# Patient Record
Sex: Female | Born: 1955 | Race: White | Hispanic: No | Marital: Single | Smoking: Never smoker
Health system: Southern US, Community
[De-identification: ages and names within clinical notes are randomized; demographics above are authoritative.]

## PROBLEM LIST (undated history)

## (undated) DIAGNOSIS — I219 Acute myocardial infarction, unspecified: Secondary | ICD-10-CM

## (undated) DIAGNOSIS — Z87442 Personal history of urinary calculi: Secondary | ICD-10-CM

## (undated) DIAGNOSIS — K219 Gastro-esophageal reflux disease without esophagitis: Secondary | ICD-10-CM

## (undated) DIAGNOSIS — I10 Essential (primary) hypertension: Secondary | ICD-10-CM

## (undated) DIAGNOSIS — E119 Type 2 diabetes mellitus without complications: Secondary | ICD-10-CM

## (undated) DIAGNOSIS — Z8719 Personal history of other diseases of the digestive system: Secondary | ICD-10-CM

## (undated) DIAGNOSIS — E7800 Pure hypercholesterolemia, unspecified: Secondary | ICD-10-CM

## (undated) DIAGNOSIS — M1990 Unspecified osteoarthritis, unspecified site: Secondary | ICD-10-CM

## (undated) DIAGNOSIS — R011 Cardiac murmur, unspecified: Secondary | ICD-10-CM

## (undated) HISTORY — PX: TONSILLECTOMY: SUR1361

## (undated) HISTORY — PX: BACK SURGERY: SHX140

## (undated) HISTORY — PX: BASAL CELL CARCINOMA EXCISION: SHX1214

## (undated) HISTORY — PX: OTHER SURGICAL HISTORY: SHX169

## (undated) HISTORY — PX: CATARACT EXTRACTION W/ INTRAOCULAR LENS  IMPLANT, BILATERAL: SHX1307

---

## 1999-08-26 ENCOUNTER — Other Ambulatory Visit: Admission: RE | Admit: 1999-08-26 | Discharge: 1999-08-26 | Payer: Self-pay | Admitting: Obstetrics & Gynecology

## 2000-11-09 ENCOUNTER — Other Ambulatory Visit: Admission: RE | Admit: 2000-11-09 | Discharge: 2000-11-09 | Payer: Self-pay | Admitting: Obstetrics & Gynecology

## 2003-04-29 ENCOUNTER — Encounter: Payer: Self-pay | Admitting: Orthopedic Surgery

## 2003-04-29 ENCOUNTER — Ambulatory Visit: Admission: RE | Admit: 2003-04-29 | Discharge: 2003-04-29 | Payer: Self-pay | Admitting: Orthopedic Surgery

## 2010-08-09 HISTORY — PX: NECK SURGERY: SHX720

## 2012-03-21 ENCOUNTER — Other Ambulatory Visit: Payer: Self-pay | Admitting: Obstetrics & Gynecology

## 2012-03-21 DIAGNOSIS — R928 Other abnormal and inconclusive findings on diagnostic imaging of breast: Secondary | ICD-10-CM

## 2012-03-27 ENCOUNTER — Ambulatory Visit
Admission: RE | Admit: 2012-03-27 | Discharge: 2012-03-27 | Disposition: A | Discharge status: Home / Self Care | Payer: Self-pay | Source: Ambulatory Visit | Attending: Obstetrics & Gynecology | Admitting: Obstetrics & Gynecology

## 2012-03-27 DIAGNOSIS — R928 Other abnormal and inconclusive findings on diagnostic imaging of breast: Secondary | ICD-10-CM

## 2012-11-08 ENCOUNTER — Other Ambulatory Visit: Payer: Self-pay | Admitting: Neurosurgery

## 2012-11-08 DIAGNOSIS — M5020 Other cervical disc displacement, unspecified cervical region: Secondary | ICD-10-CM

## 2012-11-10 ENCOUNTER — Ambulatory Visit
Admission: RE | Admit: 2012-11-10 | Discharge: 2012-11-10 | Disposition: A | Discharge status: Home / Self Care | Payer: BC Managed Care – PPO | Source: Ambulatory Visit | Attending: Neurosurgery | Admitting: Neurosurgery

## 2012-11-10 DIAGNOSIS — M5020 Other cervical disc displacement, unspecified cervical region: Secondary | ICD-10-CM

## 2012-11-14 ENCOUNTER — Encounter (HOSPITAL_COMMUNITY): Payer: Self-pay | Admitting: Pharmacy Technician

## 2012-11-14 ENCOUNTER — Other Ambulatory Visit: Payer: Self-pay | Admitting: Neurosurgery

## 2012-11-16 ENCOUNTER — Encounter (HOSPITAL_COMMUNITY)
Admission: RE | Admit: 2012-11-16 | Discharge: 2012-11-16 | Disposition: A | Discharge status: Home / Self Care | Payer: BC Managed Care – PPO | Source: Ambulatory Visit | Attending: Neurosurgery | Admitting: Neurosurgery

## 2012-11-16 ENCOUNTER — Encounter (HOSPITAL_COMMUNITY): Payer: Self-pay

## 2012-11-16 HISTORY — DX: Cardiac murmur, unspecified: R01.1

## 2012-11-16 HISTORY — DX: Unspecified osteoarthritis, unspecified site: M19.90

## 2012-11-16 LAB — BASIC METABOLIC PANEL
BUN: 22 mg/dL (ref 6–23)
CO2: 27 mEq/L (ref 19–32)
Chloride: 102 mEq/L (ref 96–112)
Creatinine, Ser: 0.65 mg/dL (ref 0.50–1.10)
GFR calc Af Amer: >90 mL/min (ref >90)
Potassium: 3.6 mEq/L (ref 3.5–5.1)

## 2012-11-16 LAB — CBC
HCT: 42.5 % (ref 36.0–46.0)
Hemoglobin: 15.5 g/dL — ABNORMAL HIGH (ref 12.0–15.0)
MCV: 84.3 fL (ref 78.0–100.0)
RBC: 5.04 MIL/uL (ref 3.87–5.11)
RDW: 13 % (ref 11.5–15.5)
WBC: 7.5 10*3/uL (ref 4.0–10.5)

## 2012-11-16 LAB — SURGICAL PCR SCREEN: Staphylococcus aureus: NEGATIVE

## 2012-11-16 NOTE — Progress Notes (Signed)
Primary Physician - Dr. Marina Goodell ([place]) Does not have a cardiologist No recent cardiac testing had stress test more than 10 years ago.

## 2012-11-16 NOTE — Pre-Procedure Instructions (Signed)
[name]  11/16/2012   Your procedure is scheduled on:  Monday, April 14th   Report to [facility] at  9:30 AM.             ( Arrival time is per your surgeon's request)   Call this number if you have problems the morning of surgery: [phone]   Remember:   Do not eat food or drink liquids after midnight Sunday.   Take these medicines the morning of surgery with A SIP OF WATER: Nothing   Do not wear jewelry, make-up or nail polish.  Do not wear lotions, powders, or perfumes. You may NOT wear deodorant.  Do not shave underarms & legs 48 hours prior to surgery.    Do not bring valuables to the hospital.  Contacts, dentures or bridgework may not be worn into surgery.   Leave suitcase in the car. After surgery it may be brought to your room.  For patients admitted to the hospital, checkout time is 11:00 AM the day of discharge.   Name and phone number of your driver:    Special Instructions: Shower using CHG 2 nights before surgery and the night before surgery.  If you shower the day of surgery use CHG.  Use special wash - you have one bottle of CHG for all showers.  You should use approximately 1/3 of the bottle for each shower.   Please read over the following fact sheets that you were given: Pain Booklet, Coughing and Deep Breathing, MRSA Information and Surgical Site Infection Prevention

## 2012-11-19 MED ORDER — CEFAZOLIN SODIUM-DEXTROSE 2-3 GM-% IV SOLR
2.0000 g | INTRAVENOUS | Status: AC
  Administered 2012-11-20: 2 g via INTRAVENOUS
  Filled 2012-11-19: qty 50

## 2012-11-20 ENCOUNTER — Encounter (HOSPITAL_COMMUNITY): Payer: Self-pay | Admitting: Certified Registered Nurse Anesthetist

## 2012-11-20 ENCOUNTER — Observation Stay (HOSPITAL_COMMUNITY)
Admission: RE | Admit: 2012-11-20 | Discharge: 2012-11-21 | Disposition: A | Discharge status: Home / Self Care | Payer: BC Managed Care – PPO | Source: Ambulatory Visit | Attending: Neurosurgery | Admitting: Neurosurgery

## 2012-11-20 ENCOUNTER — Encounter (HOSPITAL_COMMUNITY): Payer: Self-pay | Admitting: *Deleted

## 2012-11-20 ENCOUNTER — Ambulatory Visit (HOSPITAL_COMMUNITY): Payer: BC Managed Care – PPO | Admitting: Certified Registered Nurse Anesthetist

## 2012-11-20 ENCOUNTER — Ambulatory Visit (HOSPITAL_COMMUNITY): Payer: BC Managed Care – PPO

## 2012-11-20 ENCOUNTER — Encounter (HOSPITAL_COMMUNITY)
Admission: RE | Disposition: A | Discharge status: Home / Self Care | Payer: Self-pay | Source: Ambulatory Visit | Attending: Neurosurgery

## 2012-11-20 DIAGNOSIS — Z01810 Encounter for preprocedural cardiovascular examination: Secondary | ICD-10-CM | POA: Insufficient documentation

## 2012-11-20 DIAGNOSIS — M5030 Other cervical disc degeneration, unspecified cervical region: Secondary | ICD-10-CM | POA: Insufficient documentation

## 2012-11-20 DIAGNOSIS — M47812 Spondylosis without myelopathy or radiculopathy, cervical region: Secondary | ICD-10-CM | POA: Insufficient documentation

## 2012-11-20 DIAGNOSIS — E119 Type 2 diabetes mellitus without complications: Secondary | ICD-10-CM | POA: Insufficient documentation

## 2012-11-20 DIAGNOSIS — Z01812 Encounter for preprocedural laboratory examination: Secondary | ICD-10-CM | POA: Insufficient documentation

## 2012-11-20 DIAGNOSIS — Z981 Arthrodesis status: Secondary | ICD-10-CM | POA: Insufficient documentation

## 2012-11-20 DIAGNOSIS — M5020 Other cervical disc displacement, unspecified cervical region: Principal | ICD-10-CM | POA: Insufficient documentation

## 2012-11-20 HISTORY — PX: ANTERIOR CERVICAL DECOMP/DISCECTOMY FUSION: SHX1161

## 2012-11-20 LAB — GLUCOSE, CAPILLARY
Glucose-Capillary: 179 mg/dL — ABNORMAL HIGH (ref 70–99)
Glucose-Capillary: 171 mg/dL — ABNORMAL HIGH (ref 70–99)
Glucose-Capillary: 287 mg/dL — ABNORMAL HIGH (ref 70–99)
Glucose-Capillary: 229 mg/dL — ABNORMAL HIGH (ref 70–99)

## 2012-11-20 SURGERY — ANTERIOR CERVICAL DECOMPRESSION/DISCECTOMY FUSION 1 LEVEL
Anesthesia: General | Site: Neck | Wound class: Clean

## 2012-11-20 MED ORDER — ALBUMIN HUMAN 5 % IV SOLN
INTRAVENOUS | Status: DC | PRN
  Administered 2012-11-20: 11:00:00 via INTRAVENOUS

## 2012-11-20 MED ORDER — KETOROLAC TROMETHAMINE 30 MG/ML IJ SOLN
30.0000 mg | Freq: Four times a day (QID) | INTRAMUSCULAR | Status: DC
  Administered 2012-11-20 – 2012-11-21 (×4): 30 mg via INTRAVENOUS
  Filled 2012-11-20 (×7): qty 1

## 2012-11-20 MED ORDER — 0.9 % SODIUM CHLORIDE (POUR BTL) OPTIME
TOPICAL | Status: DC | PRN
  Administered 2012-11-20: 1000 mL

## 2012-11-20 MED ORDER — SODIUM CHLORIDE 0.9 % IJ SOLN: 3.0000 mL | INTRAMUSCULAR | Status: DC | PRN

## 2012-11-20 MED ORDER — LIDOCAINE-EPINEPHRINE 1 %-1:100000 IJ SOLN
INTRAMUSCULAR | Status: DC | PRN
  Administered 2012-11-20: 20 mL

## 2012-11-20 MED ORDER — LACTATED RINGERS IV SOLN
INTRAVENOUS | Status: DC | PRN
  Administered 2012-11-20 (×2): via INTRAVENOUS

## 2012-11-20 MED ORDER — MIDAZOLAM HCL 5 MG/5ML IJ SOLN
INTRAMUSCULAR | Status: DC | PRN
  Administered 2012-11-20: 2 mg via INTRAVENOUS

## 2012-11-20 MED ORDER — KETOROLAC TROMETHAMINE 30 MG/ML IJ SOLN: 30.0000 mg | Freq: Once | INTRAMUSCULAR | Status: DC

## 2012-11-20 MED ORDER — BUPIVACAINE HCL (PF) 0.5 % IJ SOLN
INTRAMUSCULAR | Status: DC | PRN
  Administered 2012-11-20: 30 mL

## 2012-11-20 MED ORDER — OXYCODONE HCL 5 MG PO TABS: 5.0000 mg | ORAL_TABLET | ORAL | Status: DC | PRN

## 2012-11-20 MED ORDER — ACETAMINOPHEN 650 MG RE SUPP: 650.0000 mg | RECTAL | Status: DC | PRN

## 2012-11-20 MED ORDER — PHENYLEPHRINE HCL 10 MG/ML IJ SOLN
INTRAMUSCULAR | Status: DC | PRN
  Administered 2012-11-20: 80 ug via INTRAVENOUS

## 2012-11-20 MED ORDER — BISACODYL 10 MG RE SUPP: 10.0000 mg | Freq: Every day | RECTAL | Status: DC | PRN

## 2012-11-20 MED ORDER — HYDROMORPHONE HCL PF 1 MG/ML IJ SOLN
0.2500 mg | INTRAMUSCULAR | Status: DC | PRN
Start: 2012-11-20 — End: 2012-11-20
  Administered 2012-11-20: 0.5 mg via INTRAVENOUS

## 2012-11-20 MED ORDER — ARTIFICIAL TEARS OP OINT
TOPICAL_OINTMENT | OPHTHALMIC | Status: DC | PRN
  Administered 2012-11-20: 1 via OPHTHALMIC

## 2012-11-20 MED ORDER — POTASSIUM CHLORIDE IN NACL 20-0.9 MEQ/L-% IV SOLN
INTRAVENOUS | Status: DC
  Administered 2012-11-20: 18:00:00 via INTRAVENOUS
  Filled 2012-11-20 (×4): qty 1000

## 2012-11-20 MED ORDER — ACETAMINOPHEN 325 MG PO TABS: 650.0000 mg | ORAL_TABLET | ORAL | Status: DC | PRN

## 2012-11-20 MED ORDER — SODIUM CHLORIDE 0.9 % IJ SOLN
3.0000 mL | Freq: Two times a day (BID) | INTRAMUSCULAR | Status: DC
  Administered 2012-11-20 (×2): 3 mL via INTRAVENOUS

## 2012-11-20 MED ORDER — THROMBIN 5000 UNITS EX SOLR
OROMUCOSAL | Status: DC | PRN
  Administered 2012-11-20: 11:00:00 via TOPICAL

## 2012-11-20 MED ORDER — ONDANSETRON HCL 4 MG/2ML IJ SOLN: 4.0000 mg | Freq: Four times a day (QID) | INTRAMUSCULAR | Status: DC | PRN

## 2012-11-20 MED ORDER — SODIUM CHLORIDE 0.9 % IV SOLN
INTRAVENOUS | Status: AC
  Filled 2012-11-20: qty 500

## 2012-11-20 MED ORDER — MAGNESIUM HYDROXIDE 400 MG/5ML PO SUSP: 30.0000 mL | Freq: Every day | ORAL | Status: DC | PRN

## 2012-11-20 MED ORDER — MORPHINE SULFATE 4 MG/ML IJ SOLN: 4.0000 mg | INTRAMUSCULAR | Status: DC | PRN

## 2012-11-20 MED ORDER — LIDOCAINE HCL (CARDIAC) 20 MG/ML IV SOLN
INTRAVENOUS | Status: DC | PRN
  Administered 2012-11-20: 70 mg via INTRAVENOUS

## 2012-11-20 MED ORDER — BACITRACIN 50000 UNITS IM SOLR
INTRAMUSCULAR | Status: AC
  Filled 2012-11-20: qty 1

## 2012-11-20 MED ORDER — ALUM & MAG HYDROXIDE-SIMETH 200-200-20 MG/5ML PO SUSP: 30.0000 mL | Freq: Four times a day (QID) | ORAL | Status: DC | PRN

## 2012-11-20 MED ORDER — SODIUM CHLORIDE 0.9 % IR SOLN
Status: DC | PRN
  Administered 2012-11-20: 10:00:00

## 2012-11-20 MED ORDER — THROMBIN 20000 UNITS EX SOLR
CUTANEOUS | Status: DC | PRN
  Administered 2012-11-20: 20000 [IU] via TOPICAL

## 2012-11-20 MED ORDER — FENTANYL CITRATE 0.05 MG/ML IJ SOLN
INTRAMUSCULAR | Status: DC | PRN
  Administered 2012-11-20 (×2): 125 ug via INTRAVENOUS

## 2012-11-20 MED ORDER — PHENOL 1.4 % MT LIQD: 1.0000 | OROMUCOSAL | Status: DC | PRN

## 2012-11-20 MED ORDER — ACETAMINOPHEN 10 MG/ML IV SOLN
1000.0000 mg | Freq: Four times a day (QID) | INTRAVENOUS | Status: DC
  Administered 2012-11-20 – 2012-11-21 (×3): 1000 mg via INTRAVENOUS
  Filled 2012-11-20 (×4): qty 100

## 2012-11-20 MED ORDER — CYCLOBENZAPRINE HCL 10 MG PO TABS: 10.0000 mg | ORAL_TABLET | Freq: Three times a day (TID) | ORAL | Status: DC | PRN

## 2012-11-20 MED ORDER — GLYCOPYRROLATE 0.2 MG/ML IJ SOLN
INTRAMUSCULAR | Status: DC | PRN
Start: 2012-11-20 — End: 2012-11-20
  Administered 2012-11-20: 0.4 mg via INTRAVENOUS

## 2012-11-20 MED ORDER — LIDOCAINE HCL 4 % MT SOLN
OROMUCOSAL | Status: DC | PRN
  Administered 2012-11-20: 4 mL via TOPICAL

## 2012-11-20 MED ORDER — ROCURONIUM BROMIDE 100 MG/10ML IV SOLN
INTRAVENOUS | Status: DC | PRN
  Administered 2012-11-20: 40 mg via INTRAVENOUS

## 2012-11-20 MED ORDER — HEMOSTATIC AGENTS (NO CHARGE) OPTIME
TOPICAL | Status: DC | PRN
  Administered 2012-11-20: 1 via TOPICAL

## 2012-11-20 MED ORDER — ACETAMINOPHEN 10 MG/ML IV SOLN
INTRAVENOUS | Status: AC
  Administered 2012-11-20: 1000 mg via INTRAVENOUS
  Filled 2012-11-20: qty 100

## 2012-11-20 MED ORDER — EPHEDRINE SULFATE 50 MG/ML IJ SOLN
INTRAMUSCULAR | Status: DC | PRN
  Administered 2012-11-20 (×2): 10 mg via INTRAVENOUS

## 2012-11-20 MED ORDER — DEXAMETHASONE SODIUM PHOSPHATE 4 MG/ML IJ SOLN
INTRAMUSCULAR | Status: DC | PRN
  Administered 2012-11-20: 4 mg via INTRAVENOUS

## 2012-11-20 MED ORDER — ONDANSETRON HCL 4 MG/2ML IJ SOLN
INTRAMUSCULAR | Status: DC | PRN
  Administered 2012-11-20: 4 mg via INTRAVENOUS

## 2012-11-20 MED ORDER — KETOROLAC TROMETHAMINE 30 MG/ML IJ SOLN
INTRAMUSCULAR | Status: AC
  Filled 2012-11-20: qty 1

## 2012-11-20 MED ORDER — NEOSTIGMINE METHYLSULFATE 1 MG/ML IJ SOLN
INTRAMUSCULAR | Status: DC | PRN
  Administered 2012-11-20: 3 mg via INTRAVENOUS

## 2012-11-20 MED ORDER — MENTHOL 3 MG MT LOZG: 1.0000 | LOZENGE | OROMUCOSAL | Status: DC | PRN

## 2012-11-20 MED ORDER — PROPOFOL 10 MG/ML IV BOLUS
INTRAVENOUS | Status: DC | PRN
  Administered 2012-11-20: 200 mg via INTRAVENOUS

## 2012-11-20 MED ORDER — ZOLPIDEM TARTRATE 5 MG PO TABS: 5.0000 mg | ORAL_TABLET | Freq: Every evening | ORAL | Status: DC | PRN

## 2012-11-20 MED ORDER — HYDROXYZINE HCL 25 MG PO TABS: 50.0000 mg | ORAL_TABLET | ORAL | Status: DC | PRN

## 2012-11-20 MED ORDER — INSULIN GLARGINE 100 UNIT/ML ~~LOC~~ SOLN
20.0000 [IU] | Freq: Every day | SUBCUTANEOUS | Status: DC
  Administered 2012-11-20: 30 [IU] via SUBCUTANEOUS
  Filled 2012-11-20 (×2): qty 0.3

## 2012-11-20 MED ORDER — HYDROMORPHONE HCL PF 1 MG/ML IJ SOLN
INTRAMUSCULAR | Status: AC
  Filled 2012-11-20: qty 1

## 2012-11-20 MED ORDER — ONDANSETRON HCL 4 MG/2ML IJ SOLN: 4.0000 mg | Freq: Once | INTRAMUSCULAR | Status: DC | PRN

## 2012-11-20 SURGICAL SUPPLY — 59 items
ADH SKN CLS APL DERMABOND .7 (GAUZE/BANDAGES/DRESSINGS) ×1
ADH SKN CLS LQ APL DERMABOND (GAUZE/BANDAGES/DRESSINGS) ×1
ALLOGRAFT 7X14X11 (Bone Implant) ×2 IMPLANT
BAG DECANTER FOR FLEXI CONT (MISCELLANEOUS) ×2 IMPLANT
BIT DRILL NEURO 2X3.1 SFT TUCH (MISCELLANEOUS) ×1 IMPLANT
BLADE ULTRA TIP 2M (BLADE) ×2 IMPLANT
BRUSH SCRUB EZ PLAIN DRY (MISCELLANEOUS) ×2 IMPLANT
CANISTER SUCTION 2500CC (MISCELLANEOUS) ×2 IMPLANT
CLIP TI MEDIUM 6 (CLIP) ×2 IMPLANT
CLOTH BEACON ORANGE TIMEOUT ST (SAFETY) ×2 IMPLANT
CONT SPEC 4OZ CLIKSEAL STRL BL (MISCELLANEOUS) ×2 IMPLANT
COVER MAYO STAND STRL (DRAPES) ×2 IMPLANT
DECANTER SPIKE VIAL GLASS SM (MISCELLANEOUS) ×2 IMPLANT
DERMABOND ADHESIVE PROPEN (GAUZE/BANDAGES/DRESSINGS) ×1
DERMABOND ADVANCED (GAUZE/BANDAGES/DRESSINGS) ×1
DERMABOND ADVANCED .7 DNX12 (GAUZE/BANDAGES/DRESSINGS) ×1 IMPLANT
DERMABOND ADVANCED .7 DNX6 (GAUZE/BANDAGES/DRESSINGS) ×1 IMPLANT
DRAPE LAPAROTOMY 100X72 PEDS (DRAPES) ×2 IMPLANT
DRAPE MICROSCOPE LEICA (MISCELLANEOUS) ×2 IMPLANT
DRAPE POUCH INSTRU U-SHP 10X18 (DRAPES) ×2 IMPLANT
DRAPE PROXIMA HALF (DRAPES) IMPLANT
DRILL NEURO 2X3.1 SOFT TOUCH (MISCELLANEOUS) ×2
ELECT COATED BLADE 2.86 ST (ELECTRODE) ×2 IMPLANT
ELECT REM PT RETURN 9FT ADLT (ELECTROSURGICAL) ×2
ELECTRODE REM PT RTRN 9FT ADLT (ELECTROSURGICAL) ×1 IMPLANT
GLOVE BIOGEL PI IND STRL 8 (GLOVE) ×3 IMPLANT
GLOVE BIOGEL PI IND STRL 8.5 (GLOVE) ×1 IMPLANT
GLOVE BIOGEL PI INDICATOR 8 (GLOVE) ×3
GLOVE BIOGEL PI INDICATOR 8.5 (GLOVE) ×1
GLOVE ECLIPSE 7.5 STRL STRAW (GLOVE) ×2 IMPLANT
GLOVE ECLIPSE 8.5 STRL (GLOVE) ×2 IMPLANT
GLOVE EXAM NITRILE LRG STRL (GLOVE) ×2 IMPLANT
GLOVE EXAM NITRILE MD LF STRL (GLOVE) IMPLANT
GLOVE EXAM NITRILE XL STR (GLOVE) IMPLANT
GLOVE EXAM NITRILE XS STR PU (GLOVE) IMPLANT
GLOVE INDICATOR 8.0 STRL GRN (GLOVE) ×6 IMPLANT
GOWN BRE IMP SLV AUR LG STRL (GOWN DISPOSABLE) IMPLANT
GOWN BRE IMP SLV AUR XL STRL (GOWN DISPOSABLE) ×5 IMPLANT
GOWN STRL REIN 2XL LVL4 (GOWN DISPOSABLE) ×4 IMPLANT
HEAD HALTER (SOFTGOODS) ×2 IMPLANT
HEMOSTAT POWDER SURGIFOAM 1G (HEMOSTASIS) ×1 IMPLANT
HEMOSTAT SURGICEL 2X14 (HEMOSTASIS) ×1 IMPLANT
KIT BASIN OR (CUSTOM PROCEDURE TRAY) ×2 IMPLANT
KIT ROOM TURNOVER OR (KITS) ×2 IMPLANT
NEEDLE HYPO 25X1 1.5 SAFETY (NEEDLE) ×2 IMPLANT
NEEDLE SPNL 22GX3.5 QUINCKE BK (NEEDLE) ×2 IMPLANT
NS IRRIG 1000ML POUR BTL (IV SOLUTION) ×2 IMPLANT
PACK LAMINECTOMY NEURO (CUSTOM PROCEDURE TRAY) ×2 IMPLANT
PAD ARMBOARD 7.5X6 YLW CONV (MISCELLANEOUS) ×6 IMPLANT
RUBBERBAND STERILE (MISCELLANEOUS) ×4 IMPLANT
SPONGE INTESTINAL PEANUT (DISPOSABLE) ×2 IMPLANT
SPONGE SURGIFOAM ABS GEL SZ50 (HEMOSTASIS) ×4 IMPLANT
STAPLER SKIN PROX WIDE 3.9 (STAPLE) ×2 IMPLANT
SUT VIC AB 2-0 CP2 18 (SUTURE) ×2 IMPLANT
SUT VIC AB 3-0 SH 8-18 (SUTURE) ×2 IMPLANT
SYR 20ML ECCENTRIC (SYRINGE) ×2 IMPLANT
TOWEL OR 17X24 6PK STRL BLUE (TOWEL DISPOSABLE) IMPLANT
TOWEL OR 17X26 10 PK STRL BLUE (TOWEL DISPOSABLE) ×2 IMPLANT
WATER STERILE IRR 1000ML POUR (IV SOLUTION) ×2 IMPLANT

## 2012-11-20 NOTE — Anesthesia Postprocedure Evaluation (Signed)
  Anesthesia Post-op Note  Patient: [name]  Procedure(s) Performed: Procedure(s) with comments: ANTERIOR CERVICAL DECOMPRESSION/DISCECTOMY FUSION 1 LEVEL (N/A) - Cervical four - five  anterior cervical decompression with fusion plating and bonegraft  Patient Location: PACU  Anesthesia Type:General  Level of Consciousness: awake, oriented and patient cooperative  Airway and Oxygen Therapy: Patient Spontanous Breathing  Post-op Pain: mild  Post-op Assessment: Post-op Vital signs reviewed, Patient's Cardiovascular Status Stable, Respiratory Function Stable, Patent Airway, No signs of Nausea or vomiting and Pain level controlled  Post-op Vital Signs: stable  Complications: No apparent anesthesia complications

## 2012-11-20 NOTE — H&P (Signed)
Subjective: Patient is a 57 y.o. female who is admitted for treatment of acute left C4-5 cervical disc herniation with profound weakness of the left deltoid and marked weakness of the left biceps.  The patient is 5 weeks status post a 2 level C5-6 and C6-7 ACDF. She did well initially following surgery but about 1 week following surgery she developed acute pain in the proximal left upper extremity. An x-ray at that time looked good. Patient was treated with an NSAID.  When she returns a couple of weeks later she had developed significant weakness in the left deltoid and biceps. We then further evaluate her with CT and MRI of the cervical spine. CT showed a good fusion construct at the C5-6 and C6-7 levels without neural impingement at those levels. The MRI scan also confirmed good decompression at the C5-6 and C6-7 levels, however at the C4-5 level although there had been a mild uncinate spur encroaching the left C4-5 neural foramen prior to her surgery last month, there was now seen a superimposed soft disc herniation that was not present on the preoperative MRI from February. Patient is admitted now for a C4-5 anterior cervical decompression arthrodesis with allograft and cervical plating because of both the pain but most significantly because of the profound weakness of the proximal left upper extremity.   Past Medical History  Diagnosis Date  . Heart murmur   . Diabetes mellitus without complication     fasting blood sugars 150s  . Arthritis     Past Surgical History  Procedure Laterality Date  . Neck fusion    . Eye surgery      cataract bilteral  . Tonsillectomy      Prescriptions prior to admission  Medication Sig Dispense Refill  . insulin glargine (LANTUS) 100 UNIT/ML injection Inject 20-30 Units into the skin at bedtime. Based on sugar levels       Allergies  Allergen Reactions  . Sulfa Antibiotics Other (See Comments)    Blisters on nose    History  Substance Use Topics  .  Smoking status: Never Smoker   . Smokeless tobacco: Not on file  . Alcohol Use: No    History reviewed. No pertinent family history.   Review of Systems A comprehensive review of systems was negative.  Objective: Vital signs in last 24 hours: Temp:  [97.7 F (36.5 C)] 97.7 F (36.5 C) (04/14 0706) Pulse Rate:  [82] 82 (04/14 0706) Resp:  [22] 22 (04/14 0706) BP: (143)/(102) 143/102 mmHg (04/14 0706) SpO2:  [97 %] 97 % (04/14 0706)  EXAM: Patient is a well-developed well-nourished white female in no acute distress. Previous anterior cervical incision is nicely healed. Lungs are clear to auscultation , the patient has symmetrical respiratory excursion. Heart has a regular rate and rhythm normal S1 and S2 no murmur.   Abdomen is soft nontender nondistended bowel sounds are present. Extremity examination shows no clubbing cyanosis or edema. Neurologic examination shows the right upper extremity with 5/5 strength in the deltoid, biceps, triceps, intrinsics, and grip. The left upper extremity he shows the deltoid is 2/5, the biceps is 3-4 minus/5, in the triceps, intrinsics, grip are 5/5.  Data Review:CBC    Component Value Date/Time   WBC 7.5 11/16/2012 1416   RBC 5.04 11/16/2012 1416   HGB 15.5* 11/16/2012 1416   HCT 42.5 11/16/2012 1416   PLT 197 11/16/2012 1416   MCV 84.3 11/16/2012 1416   MCH 30.8 11/16/2012 1416   MCHC 36.5* 11/16/2012  1416   RDW 13.0 11/16/2012 1416                          BMET    Component Value Date/Time   NA 138 11/16/2012 1416   K 3.6 11/16/2012 1416   CL 102 11/16/2012 1416   CO2 27 11/16/2012 1416   GLUCOSE 135* 11/16/2012 1416   BUN 22 11/16/2012 1416   CREATININE 0.65 11/16/2012 1416   CALCIUM 10.0 11/16/2012 1416   GFRNONAA >90 11/16/2012 1416   GFRAA >90 11/16/2012 1416     Assessment/Plan: Patient with an acute left C4-5 cervical disc herniation with pronounced weakness of the left deltoid and biceps, admitted for a C4-5 ACDF.  I've discussed with the  patient the nature of his condition, the nature the surgical procedure, the typical length of surgery, hospital stay, and overall recuperation. We discussed limitations postoperatively. I discussed risks of surgery including risks of infection, bleeding, possibly need for transfusion, the risk of nerve root dysfunction with pain, weakness, numbness, or paresthesias, the risk of spinal cord dysfunction with paralysis of all 4 limbs and quadriplegia, and the risk of dural tear and CSF leakage and possible need for further surgery, the risk of esophageal dysfunction causing dysphagia and the risk of laryngeal dysfunction causing hoarseness of the voice, the risk of failure of the arthrodesis and the possible need for further surgery, and the risk of anesthetic complications including myocardial infarction, stroke, pneumonia, and death. We also discussed the need for postoperative immobilization in a cervical collar. Understanding all this the patient does wish to proceed with surgery and is admitted for such.    Hewitt Shorts, MD 11/20/2012 9:53 AM

## 2012-11-20 NOTE — Progress Notes (Signed)
Filed Vitals:   11/20/12 1310 11/20/12 1342 11/20/12 1655 11/20/12 1656  BP:  130/71 138/91 138/91  Pulse: 77 82 94 77  Temp: 97.2 F (36.2 C) 97.7 F (36.5 C) 98.2 F (36.8 C) 98.2 F (36.8 C)  TempSrc:      Resp: 15 18 18 18   SpO2: 94% 96% 94% 94%    Patient resting in bed, comfortable. Has been up and living in the halls. Has voided. Wound clean dry. Eating.  Plan: Continue progress to postoperative recovery.  Hewitt Shorts, MD 11/20/2012, 5:16 PM

## 2012-11-20 NOTE — Plan of Care (Signed)
Problem: Consults Goal: Diagnosis - Spinal Surgery Outcome: Completed/Met Date Met:  11/20/12 Cervical Spine Fusion     

## 2012-11-20 NOTE — Transfer of Care (Signed)
Immediate Anesthesia Transfer of Care Note  Patient: [name]  Procedure(s) Performed: Procedure(s) with comments: ANTERIOR CERVICAL DECOMPRESSION/DISCECTOMY FUSION 1 LEVEL (N/A) - Cervical four - five  anterior cervical decompression with fusion plating and bonegraft  Patient Location: PACU  Anesthesia Type:General  Level of Consciousness: awake, sedated and patient cooperative  Airway & Oxygen Therapy: Patient Spontanous Breathing and Patient connected to nasal cannula oxygen  Post-op Assessment: Report given to PACU RN, Post -op Vital signs reviewed and stable and Patient moving all extremities X 4  Post vital signs: Reviewed and stable  Complications: No apparent anesthesia complications

## 2012-11-20 NOTE — Anesthesia Procedure Notes (Signed)
Procedure Name: Intubation Date/Time: 11/20/2012 10:08 AM Performed by: Orvilla Fus A Pre-anesthesia Checklist: Patient identified, Timeout performed, Emergency Drugs available, Suction available and Patient being monitored Patient Re-evaluated:Patient Re-evaluated prior to inductionOxygen Delivery Method: Circle system utilized Preoxygenation: Pre-oxygenation with 100% oxygen Intubation Type: IV induction Ventilation: Mask ventilation without difficulty Laryngoscope Size: Mac and 4 Grade View: Grade III Tube type: Oral Tube size: 7.0 mm Number of attempts: 2 (DLx1 CRNA, DLx1 MDA) Airway Equipment and Method: Stylet,  LTA kit utilized and Bougie stylet Placement Confirmation: ETT inserted through vocal cords under direct vision,  breath sounds checked- equal and bilateral and positive ETCO2 Secured at: 21 cm Tube secured with: Tape Dental Injury: Teeth and Oropharynx as per pre-operative assessment

## 2012-11-20 NOTE — Op Note (Signed)
11/20/2012  12:13 PM  PATIENT:  [name]  57 y.o. female  PRE-OPERATIVE DIAGNOSIS:  C4-5 herniated cervical disc, cervical spondylosis, cervical degenerative disease, cervical radiculopathy  POST-OPERATIVE DIAGNOSIS:  C4-5 herniated cervical disc, cervical spondylosis, cervical degenerative disease, cervical radiculopathy  PROCEDURE:  Procedure(s): ANTERIOR CERVICAL DECOMPRESSION/DISCECTOMY FUSION 1 LEVEL: C4-5 anterior cervical decompression arthrodesis with allograft and tether cervical plating  SURGEON:  Surgeon(s): Hewitt Shorts, MD Temple Pacini, MD  ASSISTANTS: Julio Sicks, M.D.  ANESTHESIA:   general  EBL:  Total I/O In: 1950 [I.V.:1700; IV Piggyback:250] Out: 250 [Blood:250]  BLOOD ADMINISTERED:none  COUNT: Correct per nursing staff  DICTATION: Patient was brought to the operating room placed under general endotracheal anesthesia. Patient was placed in 10 pounds of halter traction. The neck was prepped with Betadine soap and solution and draped in a sterile fashion. A horizontal incision was made on the left side of the neck. The line of the incision was infiltrated with local anesthetic with epinephrine. Dissection was carried down thru the subcutaneous tissue and platysma, bipolar cautery was used to maintain hemostasis. Dissection was then carried down thru an avascular plane leaving the sternocleidomastoid carotid artery and jugular vein laterally and the trachea and esophagus medially. The ventral aspect of the vertebral column was identified, we the existing plate in its upper extend at the C5 vertebra, and thereby identified the C4-5 level. The annulus was incised and the disc space entered. Discectomy was performed with micro-curettes and pituitary rongeurs. The operating microscope was draped and brought into the field provided additional magnification illumination and visualization. Discectomy was continued posteriorly thru the disc space and then the  cartilaginous endplate was removed using micro-curettes along with the high-speed drill. Posterior osteophytic overgrowth was removed using the high-speed drill along with a 2 mm thin footplated Kerrison punch. Posterior longitudinal ligament along with disc herniation was carefully removed, decompressing the spinal canal and thecal sac. We then continued to remove osteophytic overgrowth and disc material decompressing the neural foramina and exiting nerve roots bilaterally. Once the decompression was completed hemostasis was established with the use of Gelfoam with thrombin, Surgifoam, and bipolar cautery. The Gelfoam was removed the wound irrigated and hemostasis confirmed. We had encountered some bleeding from the overlying soft tissues, this was controlled with bipolar cautery, and we did lay some Surgicel over the area as well. We then measured the height of the intravertebral disc space and selected a 7 millimeter in height structural allograft. It was hydrated and saline solution and then gently positioned in the intravertebral disc space and countersunk. We then selected a 12 millimeter in height Tether cervical plate. It was positioned over the fusion construct and secured to the vertebra with 4 x 13 mm fixed screws at the C4 and level, and 4 x 13 mm variable screws at the C5 level. Each screw hole was started with the high-speed drill and then the screws placed once all the screws were placed final tightening was performed. The wound was irrigated with bacitracin solution checked for hemostasis which was established and confirmed. An x-ray was taken which showed grafts in good position, the plate and screws in good position, and the overall alignment to be good. We then proceeded with closure. The platysma was closed with interrupted inverted 2-0 undyed Vicryl suture, the subcutaneous and subcuticular closed with interrupted inverted 3-0 undyed Vicryl suture. The skin edges were approximated with  Dermabond. Following surgery the patient was taken out of cervical traction. To be reversed and  the anesthetic and taken to the recovery room for further care.   PLAN OF CARE: Admit for overnight observation  PATIENT DISPOSITION:  PACU - hemodynamically stable.   Delay start of Pharmacological VTE agent (>24hrs) due to surgical blood loss or risk of bleeding:  yes

## 2012-11-20 NOTE — Anesthesia Preprocedure Evaluation (Signed)
Anesthesia Evaluation  Patient identified by MRN, date of birth, ID band Patient awake    Reviewed: Allergy & Precautions, H&P , NPO status , Patient's Chart, lab work & pertinent test results  Airway Mallampati: I TM Distance: >3 FB Neck ROM: full    Dental   Pulmonary          Cardiovascular Rhythm:regular Rate:Normal     Neuro/Psych    GI/Hepatic   Endo/Other  diabetes, Type 2, Oral Hypoglycemic Agents  Renal/GU      Musculoskeletal   Abdominal   Peds  Hematology   Anesthesia Other Findings   Reproductive/Obstetrics                           Anesthesia Physical Anesthesia Plan  ASA: III  Anesthesia Plan: General   Post-op Pain Management:    Induction: Intravenous  Airway Management Planned: Oral ETT  Additional Equipment:   Intra-op Plan:   Post-operative Plan: Extubation in OR  Informed Consent: I have reviewed the patients History and Physical, chart, labs and discussed the procedure including the risks, benefits and alternatives for the proposed anesthesia with the patient or authorized representative who has indicated his/her understanding and acceptance.     Plan Discussed with: CRNA, Anesthesiologist and Surgeon  Anesthesia Plan Comments:         Anesthesia Quick Evaluation

## 2012-11-20 NOTE — Preoperative (Signed)
Beta Blockers   Reason not to administer Beta Blockers:Not Applicable 

## 2012-11-21 ENCOUNTER — Encounter (HOSPITAL_COMMUNITY): Payer: Self-pay | Admitting: Neurosurgery

## 2012-11-21 LAB — GLUCOSE, CAPILLARY: Glucose-Capillary: 111 mg/dL — ABNORMAL HIGH (ref 70–99)

## 2012-11-21 NOTE — Discharge Summary (Signed)
Physician Discharge Summary  Patient ID: [name] MRN: [id number] DOB/AGE: 03/01/1956 57 y.o.  Admit date: 11/20/2012 Discharge date: 11/21/2012  Admission Diagnoses:  C4-5 herniated cervical disc, cervical spondylosis, cervical degenerative disease, cervical radiculopathy  Discharge Diagnoses:   C4-5 herniated cervical disc, cervical spondylosis, cervical degenerative disease, cervical radiculopathy  Discharged Condition: good  Hospital Course: Patient, underwent a C4-5 ACDF. She is done well following surgery. She is up and living. Her wound is healing well. She is voiding well. She is eating well. She is being discharged home with instructions regarding wound care and activities. She is to return for followup with me in 3 weeks.  Discharge Exam: Blood pressure 122/80, pulse 72, temperature 98.5 F (36.9 C), temperature source Oral, resp. rate 18, SpO2 94.00%.  Disposition: Home     Medication List    TAKE these medications       insulin glargine 100 UNIT/ML injection  Commonly known as:  LANTUS  Inject 20-30 Units into the skin at bedtime. Based on sugar levels         Signed: Hewitt Shorts, MD 11/21/2012, 9:10 AM

## 2013-08-14 IMAGING — CR DG CERVICAL SPINE 1V
1 series · 1 of 1 positions shown · non-contrast
Comparison: CT cervical spine 11/10/2012

CLINICAL DATA: Cervical fusion C4-5

DG CERVICAL SPINE - 1 VIEW

[view not recorded]
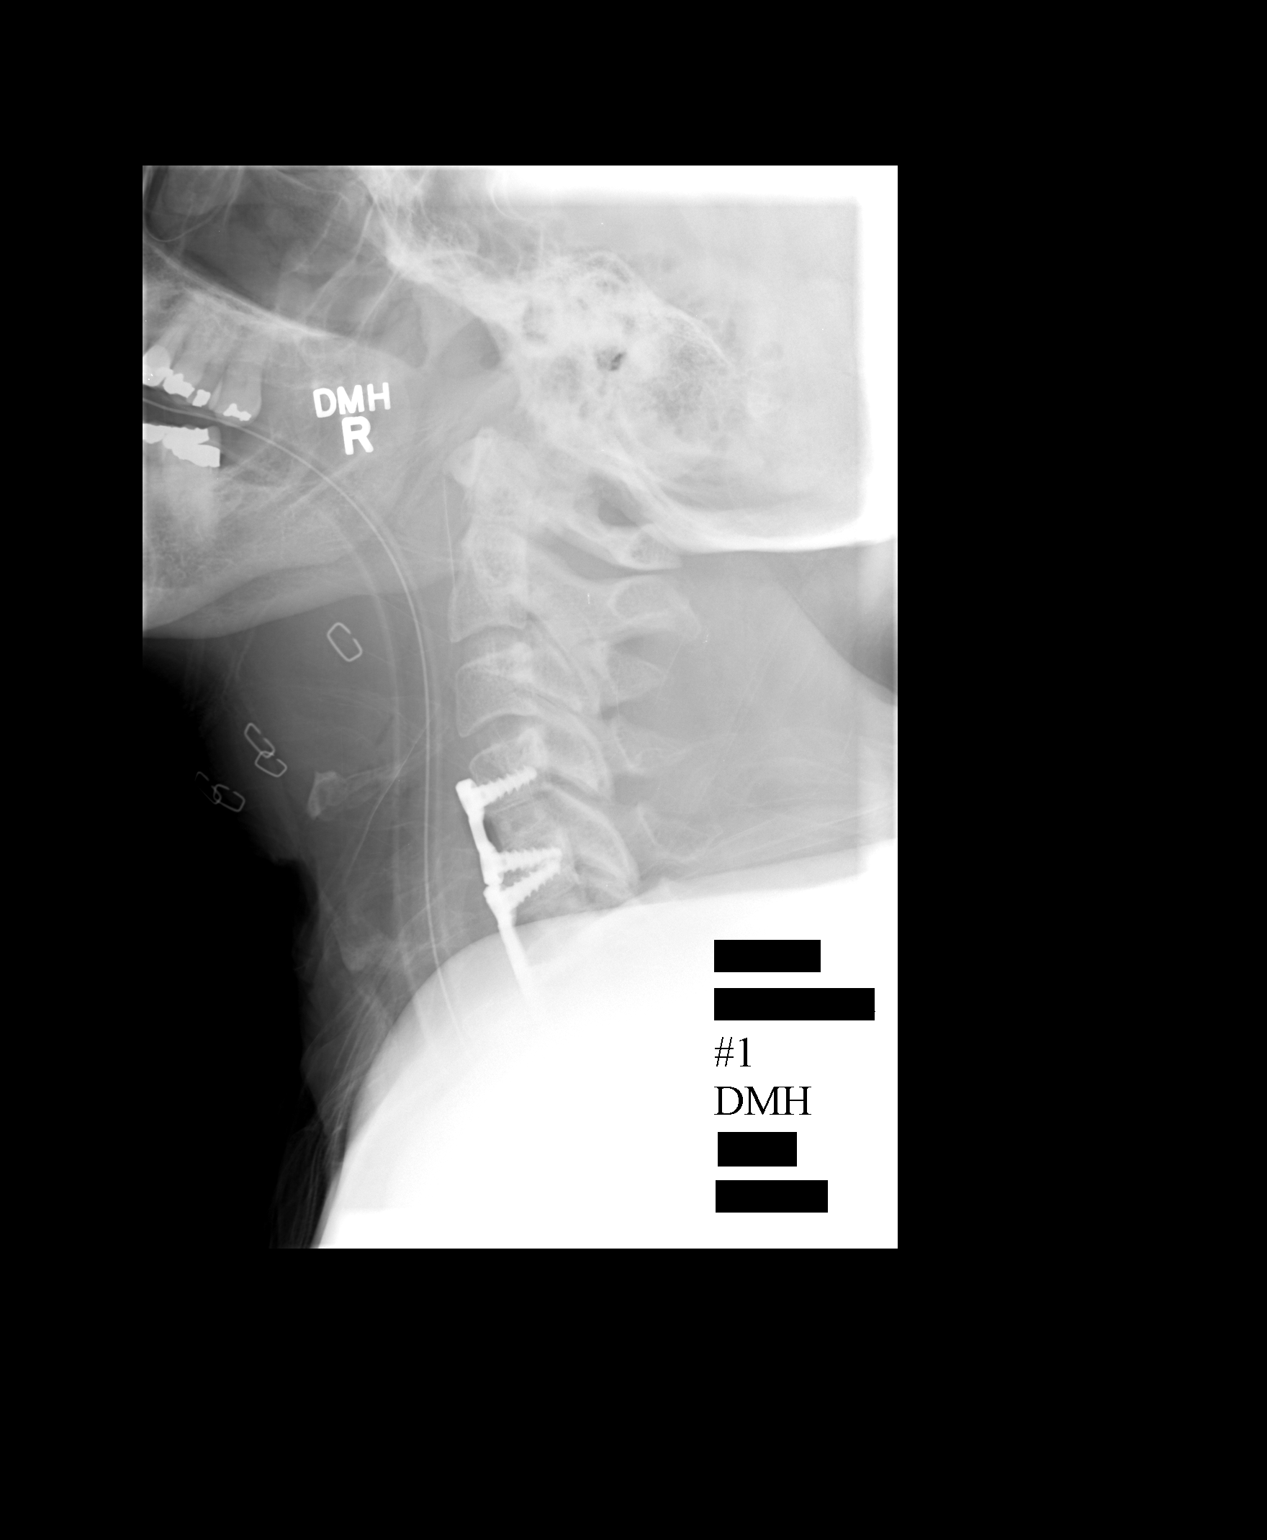

[1 of 1 positions shown; findings below may reference images not displayed]

FINDINGS: Prior ACDF C5-6 and C6-7 with anterior plate and
interbody bone graft is unchanged from the  prior CT.

Interval ACDF C4-5 with anterior plate and screws and interbody
bone graft in good position.

Normal alignment.  No fracture or acute complication.  The patient
is intubated.
IMPRESSION: Satisfactory ACDF C4-5.  Previous ACDF C5-6 and C6-7 is unchanged.

## 2014-01-23 ENCOUNTER — Other Ambulatory Visit: Payer: Self-pay | Admitting: Obstetrics & Gynecology

## 2014-01-23 DIAGNOSIS — R109 Unspecified abdominal pain: Secondary | ICD-10-CM

## 2014-01-28 ENCOUNTER — Ambulatory Visit
Admission: RE | Admit: 2014-01-28 | Discharge: 2014-01-28 | Disposition: A | Discharge status: Home / Self Care | Payer: BC Managed Care – PPO | Source: Ambulatory Visit | Attending: Obstetrics & Gynecology | Admitting: Obstetrics & Gynecology

## 2014-01-28 DIAGNOSIS — R109 Unspecified abdominal pain: Secondary | ICD-10-CM

## 2014-01-28 MED ORDER — IOHEXOL 300 MG/ML  SOLN
100.0000 mL | Freq: Once | INTRAMUSCULAR | Status: AC | PRN
Start: 2014-01-28 — End: 2014-01-28
  Administered 2014-01-28: 100 mL via INTRAVENOUS

## 2014-02-06 DIAGNOSIS — I219 Acute myocardial infarction, unspecified: Secondary | ICD-10-CM

## 2014-02-06 HISTORY — DX: Acute myocardial infarction, unspecified: I21.9

## 2014-03-05 ENCOUNTER — Encounter (HOSPITAL_COMMUNITY)
Admission: EM | Disposition: A | Discharge status: Home / Self Care | Payer: BC Managed Care – PPO | Source: Home / Self Care | Attending: Cardiology

## 2014-03-05 ENCOUNTER — Encounter (HOSPITAL_COMMUNITY): Payer: Self-pay | Admitting: Emergency Medicine

## 2014-03-05 ENCOUNTER — Inpatient Hospital Stay (HOSPITAL_COMMUNITY): Payer: BC Managed Care – PPO

## 2014-03-05 ENCOUNTER — Emergency Department (HOSPITAL_COMMUNITY): Payer: BC Managed Care – PPO

## 2014-03-05 ENCOUNTER — Inpatient Hospital Stay (HOSPITAL_COMMUNITY)
Admission: EM | Admit: 2014-03-05 | Discharge: 2014-03-07 | DRG: 247 | Disposition: A | Discharge status: Home / Self Care | Payer: BC Managed Care – PPO | Attending: Cardiology | Admitting: Cardiology

## 2014-03-05 DIAGNOSIS — Z981 Arthrodesis status: Secondary | ICD-10-CM

## 2014-03-05 DIAGNOSIS — E119 Type 2 diabetes mellitus without complications: Secondary | ICD-10-CM | POA: Diagnosis present

## 2014-03-05 DIAGNOSIS — Z882 Allergy status to sulfonamides status: Secondary | ICD-10-CM

## 2014-03-05 DIAGNOSIS — Z955 Presence of coronary angioplasty implant and graft: Secondary | ICD-10-CM

## 2014-03-05 DIAGNOSIS — Z794 Long term (current) use of insulin: Secondary | ICD-10-CM

## 2014-03-05 DIAGNOSIS — I2109 ST elevation (STEMI) myocardial infarction involving other coronary artery of anterior wall: Principal | ICD-10-CM | POA: Diagnosis present

## 2014-03-05 DIAGNOSIS — I459 Conduction disorder, unspecified: Secondary | ICD-10-CM | POA: Diagnosis present

## 2014-03-05 DIAGNOSIS — E7800 Pure hypercholesterolemia, unspecified: Secondary | ICD-10-CM | POA: Diagnosis present

## 2014-03-05 DIAGNOSIS — M129 Arthropathy, unspecified: Secondary | ICD-10-CM | POA: Diagnosis present

## 2014-03-05 DIAGNOSIS — I10 Essential (primary) hypertension: Secondary | ICD-10-CM | POA: Diagnosis present

## 2014-03-05 DIAGNOSIS — R011 Cardiac murmur, unspecified: Secondary | ICD-10-CM | POA: Diagnosis present

## 2014-03-05 DIAGNOSIS — Z8249 Family history of ischemic heart disease and other diseases of the circulatory system: Secondary | ICD-10-CM

## 2014-03-05 HISTORY — PX: LEFT HEART CATHETERIZATION WITH CORONARY ANGIOGRAM: SHX5451

## 2014-03-05 LAB — COMPREHENSIVE METABOLIC PANEL
ALK PHOS: 55 U/L (ref 39–117)
ALT: 25 U/L (ref 0–35)
AST: 23 U/L (ref 0–37)
Albumin: 3.4 g/dL — ABNORMAL LOW (ref 3.5–5.2)
Anion gap: 12 (ref 5–15)
BILIRUBIN TOTAL: 0.3 mg/dL (ref 0.3–1.2)
BUN: 19 mg/dL (ref 6–23)
CHLORIDE: 106 meq/L (ref 96–112)
CO2: 21 mEq/L (ref 19–32)
Calcium: 8.5 mg/dL (ref 8.4–10.5)
Creatinine, Ser: 0.48 mg/dL — ABNORMAL LOW (ref 0.50–1.10)
GFR calc non Af Amer: >90 mL/min (ref >90)
GLUCOSE: 202 mg/dL — AB (ref 70–99)
POTASSIUM: 4.3 meq/L (ref 3.7–5.3)
SODIUM: 139 meq/L (ref 137–147)
TOTAL PROTEIN: 6.4 g/dL (ref 6.0–8.3)

## 2014-03-05 LAB — CBC WITH DIFFERENTIAL/PLATELET
BASOS PCT: 0 % (ref 0–1)
Basophils Absolute: 0 10*3/uL (ref 0.0–0.1)
Eosinophils Absolute: 0 10*3/uL (ref 0.0–0.7)
Eosinophils Relative: 0 % (ref 0–5)
HEMATOCRIT: 40.3 % (ref 36.0–46.0)
HEMOGLOBIN: 13.8 g/dL (ref 12.0–15.0)
LYMPHS ABS: 1.2 10*3/uL (ref 0.7–4.0)
LYMPHS PCT: 17 % (ref 12–46)
MCH: 29.9 pg (ref 26.0–34.0)
MCHC: 34.2 g/dL (ref 30.0–36.0)
MCV: 87.2 fL (ref 78.0–100.0)
MONO ABS: 0.2 10*3/uL (ref 0.1–1.0)
MONOS PCT: 3 % (ref 3–12)
NEUTROS PCT: 80 % — AB (ref 43–77)
Neutro Abs: 5.8 10*3/uL (ref 1.7–7.7)
Platelets: 160 10*3/uL (ref 150–400)
RBC: 4.62 MIL/uL (ref 3.87–5.11)
RDW: 12.7 % (ref 11.5–15.5)
WBC: 7.3 10*3/uL (ref 4.0–10.5)

## 2014-03-05 LAB — POCT ACTIVATED CLOTTING TIME
ACTIVATED CLOTTING TIME: 90 s
Activated Clotting Time: 501 seconds
Activated Clotting Time: 191 seconds

## 2014-03-05 LAB — CBC
HCT: 44.1 % (ref 36.0–46.0)
Hemoglobin: 15.5 g/dL — ABNORMAL HIGH (ref 12.0–15.0)
MCH: 30.5 pg (ref 26.0–34.0)
MCHC: 35.1 g/dL (ref 30.0–36.0)
MCV: 86.8 fL (ref 78.0–100.0)
Platelets: 188 10*3/uL (ref 150–400)
RBC: 5.08 MIL/uL (ref 3.87–5.11)
RDW: 12.6 % (ref 11.5–15.5)
WBC: 6.4 10*3/uL (ref 4.0–10.5)

## 2014-03-05 LAB — TSH: TSH: 0.53 u[IU]/mL (ref 0.350–4.500)

## 2014-03-05 LAB — I-STAT TROPONIN, ED: TROPONIN I, POC: 0 ng/mL (ref 0.00–0.08)

## 2014-03-05 LAB — MAGNESIUM: MAGNESIUM: 1.8 mg/dL (ref 1.5–2.5)

## 2014-03-05 LAB — BASIC METABOLIC PANEL
ANION GAP: 16 — AB (ref 5–15)
BUN: 21 mg/dL (ref 6–23)
CHLORIDE: 103 meq/L (ref 96–112)
CO2: 22 meq/L (ref 19–32)
CREATININE: 0.58 mg/dL (ref 0.50–1.10)
Calcium: 9.6 mg/dL (ref 8.4–10.5)
GFR calc Af Amer: >90 mL/min (ref >90)
GFR calc non Af Amer: >90 mL/min (ref >90)
Glucose, Bld: 195 mg/dL — ABNORMAL HIGH (ref 70–99)
Potassium: 4.1 mEq/L (ref 3.7–5.3)
SODIUM: 141 meq/L (ref 137–147)

## 2014-03-05 LAB — APTT: aPTT: 23 seconds — ABNORMAL LOW (ref 24–37)

## 2014-03-05 LAB — TROPONIN I
TROPONIN I: 0.82 ng/mL — AB (ref <0.30)
Troponin I: 0.61 ng/mL (ref <0.30)

## 2014-03-05 LAB — CBG MONITORING, ED: GLUCOSE-CAPILLARY: 198 mg/dL — AB (ref 70–99)

## 2014-03-05 LAB — HEMOGLOBIN A1C
Hgb A1c MFr Bld: 10.7 % — ABNORMAL HIGH (ref <5.7)
Mean Plasma Glucose: 260 mg/dL — ABNORMAL HIGH (ref <117)

## 2014-03-05 LAB — PROTIME-INR
INR: 1.12 (ref 0.00–1.49)
Prothrombin Time: 14.4 seconds (ref 11.6–15.2)

## 2014-03-05 LAB — MRSA PCR SCREENING: MRSA by PCR: NEGATIVE

## 2014-03-05 LAB — GLUCOSE, CAPILLARY
GLUCOSE-CAPILLARY: 225 mg/dL — AB (ref 70–99)
GLUCOSE-CAPILLARY: 159 mg/dL — AB (ref 70–99)

## 2014-03-05 SURGERY — LEFT HEART CATHETERIZATION WITH CORONARY ANGIOGRAM
Anesthesia: LOCAL

## 2014-03-05 MED ORDER — TICAGRELOR 90 MG PO TABS
ORAL_TABLET | ORAL | Status: AC
  Filled 2014-03-05: qty 1

## 2014-03-05 MED ORDER — BIVALIRUDIN 250 MG IV SOLR
INTRAVENOUS | Status: AC
  Filled 2014-03-05: qty 250

## 2014-03-05 MED ORDER — ATORVASTATIN CALCIUM 80 MG PO TABS
80.0000 mg | ORAL_TABLET | Freq: Every day | ORAL | Status: DC
  Administered 2014-03-05 – 2014-03-06 (×2): 80 mg via ORAL
  Filled 2014-03-05 (×3): qty 1

## 2014-03-05 MED ORDER — RAMIPRIL 2.5 MG PO CAPS
2.5000 mg | ORAL_CAPSULE | Freq: Every day | ORAL | Status: DC
  Administered 2014-03-06: 2.5 mg via ORAL
  Filled 2014-03-05: qty 1

## 2014-03-05 MED ORDER — ACETAMINOPHEN 325 MG PO TABS: 650.0000 mg | ORAL_TABLET | ORAL | Status: DC | PRN

## 2014-03-05 MED ORDER — NITROGLYCERIN 0.4 MG SL SUBL: 0.4000 mg | SUBLINGUAL_TABLET | SUBLINGUAL | Status: DC | PRN

## 2014-03-05 MED ORDER — ASPIRIN 81 MG PO CHEW: 324.0000 mg | CHEWABLE_TABLET | ORAL | Status: AC

## 2014-03-05 MED ORDER — TICAGRELOR 90 MG PO TABS: 90.0000 mg | ORAL_TABLET | Freq: Two times a day (BID) | ORAL | Status: DC

## 2014-03-05 MED ORDER — INSULIN ASPART 100 UNIT/ML ~~LOC~~ SOLN
0.0000 [IU] | Freq: Three times a day (TID) | SUBCUTANEOUS | Status: DC
  Administered 2014-03-05: 3 [IU] via SUBCUTANEOUS
  Administered 2014-03-06 – 2014-03-07 (×4): 2 [IU] via SUBCUTANEOUS
  Administered 2014-03-07: 3 [IU] via SUBCUTANEOUS

## 2014-03-05 MED ORDER — SODIUM CHLORIDE 0.9 % IV SOLN
INTRAVENOUS | Status: AC
  Administered 2014-03-05 (×2): via INTRAVENOUS

## 2014-03-05 MED ORDER — ASPIRIN 300 MG RE SUPP: 300.0000 mg | RECTAL | Status: AC

## 2014-03-05 MED ORDER — MIDAZOLAM HCL 2 MG/2ML IJ SOLN
INTRAMUSCULAR | Status: AC
  Filled 2014-03-05: qty 2

## 2014-03-05 MED ORDER — ONDANSETRON HCL 4 MG/2ML IJ SOLN: 4.0000 mg | Freq: Four times a day (QID) | INTRAMUSCULAR | Status: DC | PRN

## 2014-03-05 MED ORDER — METOPROLOL TARTRATE 12.5 MG HALF TABLET
12.5000 mg | ORAL_TABLET | Freq: Two times a day (BID) | ORAL | Status: DC
  Administered 2014-03-05 – 2014-03-07 (×4): 12.5 mg via ORAL
  Filled 2014-03-05 (×5): qty 1

## 2014-03-05 MED ORDER — HEPARIN SODIUM (PORCINE) 5000 UNIT/ML IJ SOLN
4000.0000 [IU] | Freq: Once | INTRAMUSCULAR | Status: AC
  Administered 2014-03-05: 4000 [IU] via INTRAVENOUS
  Filled 2014-03-05: qty 1

## 2014-03-05 MED ORDER — ASPIRIN 81 MG PO CHEW
324.0000 mg | CHEWABLE_TABLET | Freq: Once | ORAL | Status: AC
  Administered 2014-03-05: 324 mg via ORAL
  Filled 2014-03-05: qty 4

## 2014-03-05 MED ORDER — FENTANYL CITRATE 0.05 MG/ML IJ SOLN
INTRAMUSCULAR | Status: AC
  Filled 2014-03-05: qty 2

## 2014-03-05 MED ORDER — ASPIRIN 81 MG PO CHEW: 81.0000 mg | CHEWABLE_TABLET | Freq: Every day | ORAL | Status: DC

## 2014-03-05 MED ORDER — HEPARIN (PORCINE) IN NACL 2-0.9 UNIT/ML-% IJ SOLN
INTRAMUSCULAR | Status: AC
  Filled 2014-03-05: qty 1000

## 2014-03-05 MED ORDER — TICAGRELOR 90 MG PO TABS
90.0000 mg | ORAL_TABLET | Freq: Two times a day (BID) | ORAL | Status: DC
Start: 2014-03-05 — End: 2014-03-07
  Administered 2014-03-05 – 2014-03-07 (×4): 90 mg via ORAL
  Filled 2014-03-05 (×5): qty 1

## 2014-03-05 MED ORDER — NITROGLYCERIN 1 MG/10 ML FOR IR/CATH LAB
INTRA_ARTERIAL | Status: AC
  Filled 2014-03-05: qty 10

## 2014-03-05 MED ORDER — NITROGLYCERIN 0.4 MG SL SUBL
SUBLINGUAL_TABLET | SUBLINGUAL | Status: AC
  Filled 2014-03-05: qty 1

## 2014-03-05 MED ORDER — ASPIRIN EC 81 MG PO TBEC
81.0000 mg | DELAYED_RELEASE_TABLET | Freq: Every day | ORAL | Status: DC
  Administered 2014-03-06 – 2014-03-07 (×2): 81 mg via ORAL
  Filled 2014-03-05 (×2): qty 1

## 2014-03-05 MED ORDER — HEPARIN SODIUM (PORCINE) 5000 UNIT/ML IJ SOLN: 60.0000 [IU]/kg | Freq: Once | INTRAMUSCULAR | Status: DC

## 2014-03-05 MED ORDER — ACETAMINOPHEN 325 MG PO TABS
650.0000 mg | ORAL_TABLET | ORAL | Status: DC | PRN
  Administered 2014-03-06: 650 mg via ORAL
  Filled 2014-03-05: qty 2

## 2014-03-05 MED ORDER — NITROGLYCERIN IN D5W 200-5 MCG/ML-% IV SOLN: 5.0000 ug/min | INTRAVENOUS | Status: DC

## 2014-03-05 MED ORDER — LIDOCAINE HCL (PF) 1 % IJ SOLN
INTRAMUSCULAR | Status: AC
  Filled 2014-03-05: qty 30

## 2014-03-05 NOTE — ED Notes (Signed)
Per pt sts that since last night she has been having some pain in her chest and left arm. sts yesterday she was working in her yard yesterday, sts the pain eased off and came back today about 1 hour ago.

## 2014-03-05 NOTE — Progress Notes (Signed)
R femoral venous and arterial sheath pull.  Groin site at a level 0 prior to removal, and remained level 0 throughout and after sheath pull.  Holding time initiated at 1813, and continue until 1843.  R pedal pulse palpable throughout sheath pull.  Pt educated on when to hold groin site.  Will continue to monitor and update as needed.

## 2014-03-05 NOTE — Progress Notes (Signed)
Utilization Review Completed.[name] T7/28/2015  

## 2014-03-05 NOTE — ED Notes (Signed)
Shari, PA at the bedside.  

## 2014-03-05 NOTE — ED Notes (Signed)
Activated Code Stemi  

## 2014-03-05 NOTE — H&P (Signed)
[name] is an 58 y.o. female.   Chief Complaint: Chest pain HPI: Patient is 58 year old female with past medical history significant for insulin requiring diabetes mellitus, hypercholesteremia, strong family history of coronary artery disease, growth to the ER complaining of retrosternal chest pain described as pressure tightness radiating to left shoulder and arm associated with diaphoresis grade 10 over 10 while shopping lobes. Initial EKG done in the ER showed normal sinus rhythm with poor R-wave progression in one to V3 with nonspecific T wave changes repeat EKG and the ER sinus bradycardia with marked ST elevation more than millimeters diffusely suggestive of acute anterolateral/inferior wall myocardial infarction. Patient states she has been having chest pain for last 2 days but not her to back to seek any medical attention. Denies any shortness of breath denies palpitation lightheadedness or syncope. Code STEMI was called and patient was brought to the Cath Lab emergently from ER.  Past Medical History  Diagnosis Date  . Heart murmur   . Diabetes mellitus without complication     fasting blood sugars 150s  . Arthritis     Past Surgical History  Procedure Laterality Date  . Neck fusion    . Eye surgery      cataract bilteral  . Tonsillectomy    . Anterior cervical decomp/discectomy fusion N/A 11/20/2012    Procedure: ANTERIOR CERVICAL DECOMPRESSION/DISCECTOMY FUSION 1 LEVEL;  Surgeon: Hosie Spangle, MD;  Location: [facility];  Service: Neurosurgery;  Laterality: N/A;  Cervical four - five  anterior cervical decompression with fusion plating and bonegraft    History reviewed. No pertinent family history. Social History:  reports that she has never smoked. She does not have any smokeless tobacco history on file. She reports that she does not drink alcohol or use illicit drugs.  Allergies:  Allergies  Allergen Reactions  . Sulfa Antibiotics Other (See Comments)   Blisters on nose    Medications Prior to Admission  Medication Sig Dispense Refill  . insulin glargine (LANTUS) 100 UNIT/ML injection Inject 12 Units into the skin at bedtime. Based on sugar levels      . simvastatin (ZOCOR) 40 MG tablet Take 40 mg by mouth daily.      . SitaGLIPtin Phosphate (JANUVIA PO) Take 1 tablet by mouth daily.        Results for orders placed during the hospital encounter of 03/05/14 (from the past 48 hour(s))  CBG MONITORING, ED     Status: Abnormal   Collection Time    03/05/14 12:56 PM      Result Value Ref Range   Glucose-Capillary 198 (*) 70 - 99 mg/dL  CBC     Status: Abnormal   Collection Time    03/05/14  1:06 PM      Result Value Ref Range   WBC 6.4  4.0 - 10.5 K/uL   RBC 5.08  3.87 - 5.11 MIL/uL   Hemoglobin 15.5 (*) 12.0 - 15.0 g/dL   HCT 44.1  36.0 - 46.0 %   MCV 86.8  78.0 - 100.0 fL   MCH 30.5  26.0 - 34.0 pg   MCHC 35.1  30.0 - 36.0 g/dL   RDW 12.6  11.5 - 15.5 %   Platelets 188  150 - 400 K/uL  BASIC METABOLIC PANEL     Status: Abnormal   Collection Time    03/05/14  1:06 PM      Result Value Ref Range   Sodium 141  137 - 147  mEq/L   Potassium 4.1  3.7 - 5.3 mEq/L   Chloride 103  96 - 112 mEq/L   CO2 22  19 - 32 mEq/L   Glucose, Bld 195 (*) 70 - 99 mg/dL   BUN 21  6 - 23 mg/dL   Creatinine, Ser 0.58  0.50 - 1.10 mg/dL   Calcium 9.6  8.4 - 10.5 mg/dL   GFR calc non Af Amer >90  >90 mL/min   GFR calc Af Amer >90  >90 mL/min   Comment: (NOTE)     The eGFR has been calculated using the CKD EPI equation.     This calculation has not been validated in all clinical situations.     eGFR's persistently <90 mL/min signify possible Chronic Kidney     Disease.   Anion gap 16 (*) 5 - 15  I-STAT TROPOININ, ED     Status: None   Collection Time    03/05/14  1:12 PM      Result Value Ref Range   Troponin i, poc 0.00  0.00 - 0.08 ng/mL   Comment 3            Comment: Due to the release kinetics of cTnI,     a negative result within the  first hours     of the onset of symptoms does not rule out     myocardial infarction with certainty.     If myocardial infarction is still suspected,     repeat the test at appropriate intervals.   No results found.  Review of Systems  Constitutional: Negative for fever and chills.  Eyes: Positive for double vision. Negative for photophobia.  Respiratory: Negative for cough, hemoptysis, sputum production and shortness of breath.   Cardiovascular: Positive for chest pain. Negative for palpitations, orthopnea, claudication and leg swelling.  Gastrointestinal: Negative for nausea, vomiting and abdominal pain.  Genitourinary: Negative for dysuria.  Neurological: Negative for dizziness and headaches.    Blood pressure 106/77, pulse 56, temperature 98.7 F (37.1 C), temperature source Oral, resp. rate 11, height '5\' 5"'  (1.651 m), weight 81.647 kg (180 lb), SpO2 99.00%. Physical Exam  Constitutional: She is oriented to person, place, and time.  HENT:  Head: Normocephalic and atraumatic.  Eyes: Conjunctivae are normal. Pupils are equal, round, and reactive to light. Left eye exhibits no discharge.  Neck: Normal range of motion. Neck supple. No JVD present. No tracheal deviation present. No thyromegaly present.  Cardiovascular: Normal rate and regular rhythm.   Murmur (Soft systolic murmur and S4 gallop noted) heard. Respiratory: Effort normal and breath sounds normal. No respiratory distress. She has no wheezes. She has no rales.  GI: Soft. Bowel sounds are normal. She exhibits no distension. There is no tenderness. There is no rebound.  Musculoskeletal: She exhibits no edema and no tenderness.  Neurological: She is alert and oriented to person, place, and time.     Assessment/Plan Acute anterolateral/wall myocardial infarction Insulin requiring diabetes mellitus Hypercholesteremia Strong family history of coronary artery disease. Plan  Discussed with patient and family briefly  regarding emergency left cath possible PTCA stenting its risk and benefits i.e. death MI stroke need for emergency CABG local vascular complications etc. and consented for PCI  [facility] N 03/05/2014, 2:52 PM

## 2014-03-05 NOTE — CV Procedure (Signed)
Left cardiac cath/PTCA stenting report dictated on 03/05/2014 dictation number is [id number]

## 2014-03-05 NOTE — ED Provider Notes (Signed)
CSN: [id number]     Arrival date & time 03/05/14  1240 History   First MD Initiated Contact with Patient 03/05/14 1330     Chief Complaint  Patient presents with  . Chest Pain     (Consider location/radiation/quality/duration/timing/severity/associated sxs/prior Treatment) Patient is a 58 y.o. female presenting with chest pain.  Chest Pain Pain location:  Substernal area Pain quality: sharp   Pain radiates to:  L shoulder Pain severity:  Severe Onset quality:  Sudden Duration:  1 hour Timing:  Constant Progression:  Unchanged Chronicity:  New Context comment:  While out shopping Relieved by:  Nothing Worsened by:  Nothing tried Associated symptoms: diaphoresis, nausea and shortness of breath   Associated symptoms: no fever     Past Medical History  Diagnosis Date  . Heart murmur   . Diabetes mellitus without complication     fasting blood sugars 150s  . Arthritis    Past Surgical History  Procedure Laterality Date  . Neck fusion    . Eye surgery      cataract bilteral  . Tonsillectomy    . Anterior cervical decomp/discectomy fusion N/A 11/20/2012    Procedure: ANTERIOR CERVICAL DECOMPRESSION/DISCECTOMY FUSION 1 LEVEL;  Surgeon: Hewitt Shortsobert W Nudelman, MD;  Location: [facility];  Service: Neurosurgery;  Laterality: N/A;  Cervical four - five  anterior cervical decompression with fusion plating and bonegraft   History reviewed. No pertinent family history. History  Substance Use Topics  . Smoking status: Never Smoker   . Smokeless tobacco: Not on file  . Alcohol Use: No   OB History   Grav Para Term Preterm Abortions TAB SAB Ect Mult Living                 Review of Systems  Unable to perform ROS: Acuity of condition  Constitutional: Positive for diaphoresis. Negative for fever.  Respiratory: Positive for shortness of breath.   Cardiovascular: Positive for chest pain.  Gastrointestinal: Positive for nausea.      Allergies  Sulfa antibiotics  Home  Medications   Prior to Admission medications   Medication Sig Start Date End Date Taking? Authorizing Provider  insulin glargine (LANTUS) 100 UNIT/ML injection Inject 12 Units into the skin at bedtime. Based on sugar levels   Yes Historical Provider, MD  simvastatin (ZOCOR) 40 MG tablet Take 40 mg by mouth daily. 02/05/14  Yes Historical Provider, MD   BP 106/77  Pulse 56  Temp(Src) 98.7 F (37.1 C) (Oral)  Resp 11  Ht 5\' 5"  (1.651 m)  Wt 180 lb (81.647 kg)  BMI 29.95 kg/m2  SpO2 99% Physical Exam  Nursing note and vitals reviewed. Constitutional: She is oriented to person, place, and time. She appears well-developed and well-nourished. She appears distressed.  HENT:  Head: Normocephalic and atraumatic.  Mouth/Throat: Oropharynx is clear and moist.  Eyes: Conjunctivae are normal. Pupils are equal, round, and reactive to light. No scleral icterus.  Neck: Neck supple.  Cardiovascular: Normal rate, regular rhythm, normal heart sounds and intact distal pulses.   No murmur heard. Pulmonary/Chest: Effort normal and breath sounds normal. No stridor. No respiratory distress. She has no rales.  Abdominal: Soft. Bowel sounds are normal. She exhibits no distension. There is no tenderness.  Musculoskeletal: Normal range of motion.  Neurological: She is alert and oriented to person, place, and time.  Skin: Skin is warm. No rash noted. She is diaphoretic.  Psychiatric: She has a normal mood and affect. Her behavior is normal.  ED Course  Procedures (including critical care time) Labs Review Labs Reviewed  CBC - Abnormal; Notable for the following:    Hemoglobin 15.5 (*)    All other components within normal limits  BASIC METABOLIC PANEL - Abnormal; Notable for the following:    Glucose, Bld 195 (*)    Anion gap 16 (*)    All other components within normal limits  CBG MONITORING, ED - Abnormal; Notable for the following:    Glucose-Capillary 198 (*)    All other components within  normal limits  MRSA PCR SCREENING  TROPONIN I  TROPONIN I  TROPONIN I  PROTIME-INR  APTT  CBC WITH DIFFERENTIAL  TSH  COMPREHENSIVE METABOLIC PANEL  MAGNESIUM  HEMOGLOBIN A1C  I-STAT TROPOININ, ED    Imaging Review No results found.   EKG Interpretation   Date/Time:  Tuesday March 05 2014 13:18:44 EDT Ventricular Rate:  54 PR Interval:  174 QRS Duration: 144 QT Interval:  538 QTC Calculation: 510 R Axis:   27 Text Interpretation:  Age not entered, assumed to be  58 years old for  purpose of ECG interpretation Sinus rhythm Ventricular premature complex  Nonspecific intraventricular conduction delay Anterolateral infarct, acute  (LAD) Baseline wander in lead(s) III V2 V3 V4 V5 ** ** ACUTE MI / STEMI **  ** Confirmed by [facility]  MD, TREY ([id number]) on 03/05/2014 1:35:03 PM      MDM   Diagnosis ST Elevation Myocardial Infarction  58 yo female presenting with Chest pain.  Initial EKG in triage without clear ST changes.  But pt very diaphoretic and pale, so taken to resuscitation room where repeat EKG showed clear STEMI.  Code STEMI called, ASA and Heparin given, Dr. Sharyn Lull took to cath lab.      Candyce Churn III, MD 03/05/14 [id number]

## 2014-03-05 NOTE — ED Notes (Signed)
Dr. Harwani at the bedside. 

## 2014-03-06 LAB — BASIC METABOLIC PANEL
Anion gap: 13 (ref 5–15)
BUN: 16 mg/dL (ref 6–23)
CO2: 23 mEq/L (ref 19–32)
Calcium: 8.4 mg/dL (ref 8.4–10.5)
Chloride: 106 mEq/L (ref 96–112)
Creatinine, Ser: 0.56 mg/dL (ref 0.50–1.10)
GFR calc Af Amer: >90 mL/min (ref >90)
Glucose, Bld: 152 mg/dL — ABNORMAL HIGH (ref 70–99)
Potassium: 4.1 mEq/L (ref 3.7–5.3)
SODIUM: 142 meq/L (ref 137–147)

## 2014-03-06 LAB — LIPID PANEL
Cholesterol: 200 mg/dL (ref 0–200)
HDL: 29 mg/dL — AB (ref >39)
LDL Cholesterol: 132 mg/dL — ABNORMAL HIGH (ref 0–99)
Total CHOL/HDL Ratio: 6.9 RATIO
Triglycerides: 197 mg/dL — ABNORMAL HIGH (ref <150)
VLDL: 39 mg/dL (ref 0–40)

## 2014-03-06 LAB — TROPONIN I: Troponin I: 0.5 ng/mL (ref <0.30)

## 2014-03-06 LAB — GLUCOSE, CAPILLARY
Glucose-Capillary: 176 mg/dL — ABNORMAL HIGH (ref 70–99)
Glucose-Capillary: 179 mg/dL — ABNORMAL HIGH (ref 70–99)
Glucose-Capillary: 172 mg/dL — ABNORMAL HIGH (ref 70–99)

## 2014-03-06 LAB — CBC
HCT: 38.2 % (ref 36.0–46.0)
Hemoglobin: 12.9 g/dL (ref 12.0–15.0)
MCH: 29.7 pg (ref 26.0–34.0)
MCHC: 33.8 g/dL (ref 30.0–36.0)
MCV: 88 fL (ref 78.0–100.0)
PLATELETS: 151 10*3/uL (ref 150–400)
RBC: 4.34 MIL/uL (ref 3.87–5.11)
RDW: 12.8 % (ref 11.5–15.5)
WBC: 6.3 10*3/uL (ref 4.0–10.5)

## 2014-03-06 MED ORDER — RAMIPRIL 5 MG PO CAPS
5.0000 mg | ORAL_CAPSULE | Freq: Every day | ORAL | Status: DC
  Administered 2014-03-07: 5 mg via ORAL
  Filled 2014-03-06: qty 1

## 2014-03-06 MED FILL — Sodium Chloride IV Soln 0.9%: INTRAVENOUS | Qty: 50 | Status: AC

## 2014-03-06 NOTE — Progress Notes (Signed)
CARDIAC REHAB PHASE I   PRE:  Rate/Rhythm: 71 SR    BP: sitting 137/84    SaO2: 98 RA  MODE:  Ambulation: 390 ft   POST:  Rate/Rhythm: 81 SR    BP: sitting 153/89     SaO2:   Pt tolerated well. Does admit to some fatigue today. To recliner. Began ed emphasizing risk factor modification and DM control. Pt interested in CRPII and will send referral to [place].  [id number]   Elissa Lovett Byron CES, ACSM 03/06/2014 11:43 AM

## 2014-03-06 NOTE — Cardiovascular Report (Signed)
[name], Kristin             ACCOUNT NO.:  [id number]  MEDICAL RECORD NO.:  [id number]  LOCATION:  2H03C                        FACILITY:  [facility]  PHYSICIAN:  Siyana Erney N. Sharyn LullHarwani, M.D. DATE OF BIRTH:  1956/06/20  DATE OF PROCEDURE:  03/05/2014 DATE OF DISCHARGE:                           CARDIAC CATHETERIZATION   PROCEDURE: 1. Left cardiac cath with selective left and right coronary     angiography, left ventriculography via right groin using Judkins     technique. 2. Successful percutaneous transluminal coronary angioplasty to mid     and proximal LAD using 2.5 x 12 mm long Trek balloon for     predilatation. 3. Successful deployment of 3.5 x 38 mm long Xience Alpine drug-     eluting stent in proximal and mid LAD. 4. Successful postdilatation of this stent using 3.5 x 20 mm long [place]     Trek balloon going up to 13-15 atmospheric pressure.  INDICATION FOR THE PROCEDURE:  [name] is a 58 year old female with past medical history significant for insulin-requiring diabetes mellitus, hypercholesteremia, strong family history of coronary artery disease.  She drove to the ER complaining of retrosternal chest pain described as pressure, tightness radiating to the left shoulder and left arm associated with diaphoresis grade 10/10 while shopping at [employer]. Initially, EKG done in the ER showed normal sinus rhythm with poor R- wave progression in lead V1 to V3 with nonspecific T-wave changes. Repeat EKG in the ER showed sinus bradycardia with marked ST elevation more than 6 mm diffusely suggestive of acute anterolateral/inferior wall myocardial infarction.  The patient states she has been having chest pain for last 2 days but not that severe to seek any medical attention. Denies any shortness of breath.  Denies palpitation, lightheadedness, or syncope.  Code STEMI was called, and the patient was brought to the cath lab emergently from the ER.  Discussed with the patient and  family regarding emergency left cath, possible PTCA and stenting, its risks and benefits, i.e., death, MI, stroke, need for emergency CABG, local vascular complications, etc. and consented for PCI.  DESCRIPTION OF PROCEDURE:  After obtaining the informed consent, the patient was brought to the cath lab and was placed on fluoroscopy table. Right groin was prepped and draped in usual fashion.  A 1% Xylocaine was used for local anesthesia in the right groin.  With the help of thin wall needle,  a 6-French arterial sheath was placed.  The sheath was aspirated and flushed.  Next, a 6-French arterial and venous sheaths were placed.  Both the sheaths were aspirated and flushed.  Next, 6- French left Judkins catheter was advanced over the wire under fluoroscopic guidance up to the ascending aorta.  Wire was pulled out. The catheter was aspirated and connected to the Manifold.  Catheter was further advanced and engaged into left coronary ostium.  Multiple views of the left system were taken.  Next, catheter was disengaged and was pulled out over the wire and was replaced with 6-French right Judkins catheter, which was advanced over the wire under fluoroscopic guidance up to the ascending aorta.  Wire was pulled out.  The catheter was aspirated and connected to the Manifold.  Catheter  was further advanced and engaged into right coronary ostium.  A single view of right coronary artery was obtained.  Next, catheter was disengaged and was pulled out over the wire and was replaced with 6-French pigtail catheter at the end of the procedure which was advanced over the wire under fluoroscopic guidance up to the ascending aorta.  Wire was pulled out.  The catheter was aspirated and connected to the Manifold.  Catheter was further advanced across the aortic valve into the LV.  LV pressures were recorded.  Next, LV graphy was done in 30-degree RAO position.  Post- angiographic pressures were recorded from  LV and then pullback pressures were recorded from aorta.  There was no gradient across the aortic valve.  Next, the pigtail catheter was pulled out over the wire. Sheaths were aspirated and flushed.  FINDINGS: 1. LV showed mild anterolateral wall hypokinesia, EF of approximately     50-55%.  Left main was patent. 2. LAD has 99% proximal stenosis with haziness, and 70% mid stenosis.     Diagonal 1 has 30-40% proximal stenosis.  Left circumflex has 20-     30% proximal and mid stenosis.  OM1 is very, very small.  OM2 and     OM3 were small which were patent.  PDA arising from left complex     was patent.  The patient has nondominant small right which was     patent.  The patient has left dominant system.  INTERVENTIONAL PROCEDURE:  Successful PTCA to mid and proximal LAD was done using 2.5 x 12 mm long Trek balloon for predilatation and then 3.5 x 38 mm long Xience Alpine drug-eluting stent was deployed in proximal and mid LAD at 11 atmospheric pressure.  This stent was post dilated using 3.5 x 20 mm long [place] Trek balloon going up to 13-15 atmospheric pressure.  Sequential lesion dilated from 70-99% to 0% residual with excellent TIMI grade 3 distal flow without evidence of dissection or distal embolization.  The patient received weight based Angiomax, 180 mg of Brilinta prior to the procedure.  The patient tolerated the procedure well.  There were no complications.  The patient was transferred to recovery room in stable condition.     Eduardo Osier. Sharyn Lull, M.D.     M[facility]/MEDQ  D:  03/05/2014  T:  03/06/2014  Job:  [id number]

## 2014-03-06 NOTE — Progress Notes (Signed)
Subjective:  Doing well. Denies any chest pain or shortness of breath.  Objective:  Vital Signs in the last 24 hours: Temp:  [97.5 F (36.4 C)-98.7 F (37.1 C)] 98.2 F (36.8 C) (07/29 0729) Pulse Rate:  [49-90] 58 (07/29 1000) Resp:  [8-28] 18 (07/29 1000) BP: (106-151)/(57-101) 150/57 mmHg (07/29 1000) SpO2:  [96 %-100 %] 99 % (07/29 1000) Arterial Line BP: (137)/(72) 137/72 mmHg (07/28 1800) Weight:  [81.647 kg (180 lb)] 81.647 kg (180 lb) (07/28 1323)  Intake/Output from previous day: 07/28 0701 - 07/29 0700 In: 1955 [P.O.:80; I.V.:1875] Out: 1525 [Urine:1525] Intake/Output from this shift: Total I/O In: -  Out: 425 [Urine:425]  Physical Exam: Neck: no adenopathy, no carotid bruit, no JVD and supple, symmetrical, trachea midline Lungs: clear to auscultation bilaterally Heart: regular rate and rhythm, S1, S2 normal and Soft systolic murmur noted no S3 gallop Abdomen: soft, non-tender; bowel sounds normal; no masses,  no organomegaly Extremities: extremities normal, atraumatic, no cyanosis or edema and Right groin stable  Lab Results:  Recent Labs  03/05/14 1755 03/06/14 0320  WBC 7.3 6.3  HGB 13.8 12.9  PLT 160 151    Recent Labs  03/05/14 1755 03/06/14 0320  NA 139 142  K 4.3 4.1  CL 106 106  CO2 21 23  GLUCOSE 202* 152*  BUN 19 16  CREATININE 0.48* 0.56    Recent Labs  03/05/14 2155 03/06/14 0320  TROPONINI 0.82* 0.50*   Hepatic Function Panel  Recent Labs  03/05/14 1755  PROT 6.4  ALBUMIN 3.4*  AST 23  ALT 25  ALKPHOS 55  BILITOT 0.3    Recent Labs  03/06/14 0320  CHOL 200   No results found for this basename: PROTIME,  in the last 72 hours  Imaging: Imaging results have been reviewed and Dg Chest Port 1 View  03/05/2014   CLINICAL DATA:  Chest pain  EXAM: PORTABLE CHEST - 1 VIEW  COMPARISON:  07/30/2010  FINDINGS: Minimal right hemidiaphragm elevation. Lower cervical spine fixation. Apical lordotic positioning. Midline  trachea. Normal heart size. Apparent right paratracheal soft tissue fullness is favored to be technique related and is not significantly changed. Clear lungs. No congestive failure.  IMPRESSION: No acute cardiopulmonary disease.   Electronically Signed   By: Jeronimo Greaves M.D.   On: 03/05/2014 17:18    Cardiac Studies:  Assessment/Plan:  Status post acute anterolateral wall myocardial infarction status post PCI to proximal LAD Insulin requiring diabetes mellitus Hypertension Hypercholesteremia Strong family history of coronary artery disease. Plan Increase ACE inhibitor as per orders Check labs in a.m. Phase I cardiac rehabilitation Transfer to telemetry  LOS: 1 day    Hong Moring N 03/06/2014, 11:43 AM

## 2014-03-07 LAB — BASIC METABOLIC PANEL
Anion gap: 12 (ref 5–15)
BUN: 13 mg/dL (ref 6–23)
CO2: 25 mEq/L (ref 19–32)
Calcium: 9.1 mg/dL (ref 8.4–10.5)
Chloride: 104 mEq/L (ref 96–112)
Creatinine, Ser: 0.64 mg/dL (ref 0.50–1.10)
GFR calc Af Amer: >90 mL/min (ref >90)
Glucose, Bld: 196 mg/dL — ABNORMAL HIGH (ref 70–99)
Potassium: 4.3 mEq/L (ref 3.7–5.3)
Sodium: 141 mEq/L (ref 137–147)

## 2014-03-07 LAB — CBC
HCT: 42 % (ref 36.0–46.0)
HEMOGLOBIN: 14.2 g/dL (ref 12.0–15.0)
MCH: 30.1 pg (ref 26.0–34.0)
MCHC: 33.8 g/dL (ref 30.0–36.0)
MCV: 89 fL (ref 78.0–100.0)
Platelets: 141 10*3/uL — ABNORMAL LOW (ref 150–400)
RBC: 4.72 MIL/uL (ref 3.87–5.11)
RDW: 12.9 % (ref 11.5–15.5)
WBC: 4.9 10*3/uL (ref 4.0–10.5)

## 2014-03-07 LAB — GLUCOSE, CAPILLARY
Glucose-Capillary: 214 mg/dL — ABNORMAL HIGH (ref 70–99)
Glucose-Capillary: 206 mg/dL — ABNORMAL HIGH (ref 70–99)
Glucose-Capillary: 187 mg/dL — ABNORMAL HIGH (ref 70–99)

## 2014-03-07 LAB — TROPONIN I: Troponin I: <0.3 ng/mL (ref <0.30)

## 2014-03-07 MED ORDER — NITROGLYCERIN 0.4 MG SL SUBL: 0.4000 mg | SUBLINGUAL_TABLET | SUBLINGUAL | Status: AC | PRN

## 2014-03-07 MED ORDER — RAMIPRIL 5 MG PO CAPS: 5.0000 mg | ORAL_CAPSULE | Freq: Every day | ORAL | Status: AC

## 2014-03-07 MED ORDER — ASPIRIN 81 MG PO TBEC
81.0000 mg | DELAYED_RELEASE_TABLET | Freq: Every day | ORAL | Status: AC
Start: 2014-03-07 — End: ?

## 2014-03-07 MED ORDER — METOPROLOL TARTRATE 12.5 MG HALF TABLET: 12.5000 mg | ORAL_TABLET | Freq: Two times a day (BID) | ORAL | Status: AC

## 2014-03-07 MED ORDER — TICAGRELOR 90 MG PO TABS: 90.0000 mg | ORAL_TABLET | Freq: Two times a day (BID) | ORAL | Status: DC

## 2014-03-07 MED ORDER — ATORVASTATIN CALCIUM 80 MG PO TABS: 80.0000 mg | ORAL_TABLET | Freq: Every day | ORAL | Status: DC

## 2014-03-07 NOTE — Discharge Summary (Signed)
NAMSharmon Leyden:  Mallin, Elka             ACCOUNT NO.:  [id number]  MEDICAL RECORD NO.:  [id number]  LOCATION:  [id number]                        FACILITY:  [facility]  PHYSICIAN:  Eduardo OsierMohan N. Sharyn LullHarwani, M.D. DATE OF BIRTH:  Oct 11, 1955  DATE OF ADMISSION:  03/05/2014 DATE OF DISCHARGE:  03/07/2014                              DISCHARGE SUMMARY   ADMITTING DIAGNOSES: 1. Acute anterolateral wall myocardial infarction. 2. Insulin-requiring diabetes mellitus. 3. Hypercholesteremia. 4. Hypercholesterolemia. 5. Strong family history of coronary artery disease.  DISCHARGE DIAGNOSES: 1. Status post acute anterolateral wall myocardial infarction status     post percutaneous coronary intervention to proximal left anterior     descending with excellent results. 2. Insulin-requiring diabetes mellitus. 3. New-onset hypertension. 4. Hypercholesteremia. 5. Strong family history of coronary artery disease.  DISCHARGE HOME MEDICATIONS: 1. Aspirin 81 mg 1 tablet daily. 2. Atorvastatin 80 mg 1 tablet daily. 3. Metoprolol tartrate 12.5 mg twice daily. 4. Nitrostat 0.4 mg sublingual use as directed. 5. Ramipril 5 mg 1 capsule daily. 6. Brilinta 90 mg twice daily. 7. Lantus insulin 12 units at bedtime as before. 8. Januvia 100 mg 1 tablet daily as before.  DIET:  Low salt low cholesterol 1800 calories ADA diet.  Post PTCA stent instructions have been given.  FOLLOW UP:  With me in 1 week.  CONDITION AT DISCHARGE:  Stable.  The patient will be scheduled for phase 2 cardiac rehab as outpatient.  BRIEF HISTORY AND HOSPITAL COURSE:  Ms. Raechel AcheHackett is a 58 year old white female with past medical history significant for insulin-requiring diabetes mellitus, hypercholesteremia, strong family history of coronary artery disease.  She drove to the ER complaining of retrosternal chest pain described as pressure, tightness radiating to the left shoulder and left arm associated with diaphoresis grade 10/10 while shopping  at [employer].  Initial EKG done in the ER showed normal sinus rhythm with poor R-wave progression in V1 to V3 with nonspecific T-wave changes.  Repeat EKG done in the ER showed sinus bradycardia with marked ST elevation more than 6 mm diffusely suggestive of acute anterolateral/inferior wall myocardial infarction.  The patient states she has been having chest pain for last 2 days but did not seek any medical attention.  Denies any shortness of breath.  Denies palpitation, lightheadedness, or syncope. Code STEMI was called.  The patient was brought emergently to the cath lab from the ER.  PAST MEDICAL HISTORY:  As above.  PHYSICAL EXAMINATION:  GENERAL:  She was alert, awake, oriented x3. VITAL SIGNS:  Blood pressure was 106/77, pulse was 56. HEENT:  Conjunctivae was pink. NECK:  Supple.  No JVD.  No bruit. LUNGS:  Clear to auscultation without rhonchi or rales. CARDIOVASCULAR:  S1, S2 was normal.  There was soft systolic murmur and S4 gallop. ABDOMEN:  Soft.  Bowel sounds were present.  Nontender. EXTREMITIES:  There is no clubbing, cyanosis, or edema.  LABORATORY DATA:  Her sodium was 141, potassium 4.1, BUN 21, creatinine 0.58, glucose was 195.  Hemoglobin was 15.5, hematocrit 44.1, white count of 6.4.  Initial EKG showed normal sinus rhythm with poor R-wave progression in V1 to V3, and nonspecific T-wave changes.  Repeat EKG showed sinus bradycardia with PVCs and  marked ST elevation in lead V2 to V6, lead 1, lead aVL, lead\ 2 and aVF suggestive of acute anterolateral/inferior wall injury.  Repeat EKG post procedure showed normal sinus rhythm with normalization of ST elevations.  Her other labs, her hemoglobin A1c was very high at 10.7.  Troponin second set was 0.61, third set 0.82, next set 0.50.  Today troponin I is less than 0.30.  Today's labs, sodium is 141, potassium 4.3, BUN 13, creatinine 0.64, hemoglobin is 14.2, hematocrit 42.0, white count of 4.9, platelet count has  dropped to 141,000.  BRIEF HOSPITAL COURSE:  The patient was directly taken from ER to the cath lab and underwent emergent PTCA and stenting to proximal LAD as per procedure report.  The patient tolerated the procedure well.  There were no complications.  The patient had maximum bump of troponin of 0.81. Post procedure, her EKG became normal.  The patient did not had any episodes of chest pain during the hospital stay.  Phase 1 cardiac rehab was called.  The patient has been ambulating in hallway without any problems.  Her groin is stable with no evidence of hematoma or bruit. The patient will be discharged home on above medications and will be followed up in my office in 1 week.  The patient will be scheduled for phase 2 cardiac rehab as outpatient. The patient was discussed at length regarding compliance with medication, lifestyle changes.     Eduardo Osier. Sharyn Lull, M.D.     [facility]/MED[facility]  D:  03/07/2014  T:  03/07/2014  Job:  [id number]

## 2014-03-07 NOTE — Care Management Note (Signed)
    Page 1 of 1   03/07/2014     10:49:10 AM CARE MANAGEMENT NOTE 03/07/2014  Patient:  [name], [name]   Account Number:  [id number]  Date Initiated:  03/07/2014  Documentation initiated by:  GRAVES-BIGELOW,Zethan Alfieri  Subjective/Objective Assessment:   Pt admitted for chest pain- Stemi. Plan for home on brilinta.     Action/Plan:   CM did provide pt with 30 day free card. Pt will need Rx for 30 day free no refills and the original Rx with refills.   Anticipated DC Date:  03/07/2014   Anticipated DC Plan:  HOME/SELF CARE      DC Planning Services  CM consult      Choice offered to / List presented to:             Status of service:  Completed, signed off Medicare Important Message given?  NO (If response is "NO", the following Medicare IM given date fields will be blank) Date Medicare IM given:   Medicare IM given by:   Date Additional Medicare IM given:   Additional Medicare IM given by:    Discharge Disposition:  HOME/SELF CARE  Per UR Regulation:  Reviewed for med. necessity/level of care/duration of stay  If discussed at Long Length of Stay Meetings, dates discussed:    Comments:

## 2014-03-07 NOTE — Discharge Summary (Signed)
  Discharge summary dictated on 03/07/2014 dictation number is [phone]

## 2014-03-07 NOTE — Discharge Instructions (Signed)
° °  ° °Acute Coronary Syndrome °Acute coronary syndrome (ACS) is an urgent problem in which the blood and oxygen supply to the heart is critically deficient. ACS requires hospitalization because one or more coronary arteries may be blocked. °ACS represents a range of conditions including: °· Previous angina that is now unstable, lasts longer, happens at rest, or is more intense. °· A heart attack, with heart muscle cell injury and death. °There are three vital coronary arteries that supply the heart muscle with blood and oxygen so that it can pump blood effectively. If blockages to these arteries develop, blood flow to the heart muscle is reduced. If the heart does not get enough blood, angina may occur as the first warning sign. °SYMPTOMS  °· The most common signs of angina include: °· Tightness or squeezing in the chest. °· Feeling of heaviness on the chest. °· Discomfort in the arms, neck, back, or jaw. °· Shortness of breath and nausea. °· Cold, wet skin. °· Angina is usually brought on by physical effort or excitement which increase the oxygen needs of the heart. These states increase the blood flow needs of the heart beyond what can be delivered. °· Other symptoms that are not as common include: °· Fatigue °· Unexplained feelings of nervousness or anxiety °· Weakness °· Diarrhea °· Sometimes, you may not have noticed any symptoms at all but still suffered a cardiac injury. °TREATMENT  °· Medicines to help discomfort may include nitroglycerin (nitro) in the form of tablets or a spray for rapid relief, or longer-acting forms such as cream, patches, or capsules. (Be aware that there are many side effects and possible interactions with other drugs). °· Other medicines may be used to help the heart pump better. °· Procedures to open blocked arteries including angioplasty or stent placement to keep the arteries open. °· Open heart surgery may be needed when there are many blockages or they are in critical  locations that are best treated with surgery. °HOME CARE INSTRUCTIONS  °· Do not use any tobacco products including cigarettes, chewing tobacco, or electronic cigarettes. °· Take one baby or adult aspirin daily, if your health care provider advises. This helps reduce the risk of a heart attack. °· It is very important that you follow the angina treatment prescribed by your health care provider. Make arrangements for proper follow-up care. °· Eat a heart healthy diet with salt and fat restrictions as advised. °· Regular exercise is good for you as long as it does not cause discomfort. Do not begin any new type of exercise until you check with your health care provider. °· If you are overweight, you should lose weight. °· Try to maintain normal blood lipid levels. °· Keep your blood pressure under control as recommended by your health care provider. °· You should tell your health care provider right away about any increase in the severity or frequency of your chest discomfort or angina attacks. When you have angina, you should stop what you are doing and sit down. This may bring relief in 3 to 5 minutes. If your health care provider has prescribed nitro, take it as directed. °· If your health care provider has given you a follow-up appointment, it is very important to keep that appointment. Not keeping the appointment could result in a chronic or permanent injury, pain, and disability. If there is any problem keeping the appointment, you must call back to this facility for assistance. °SEEK IMMEDIATE MEDICAL CARE IF:  °· You develop   nausea, vomiting, or shortness of breath. °· You feel faint, lightheaded, or pass out. °· Your chest discomfort gets worse. °· You are sweating or experience sudden profound fatigue. °· You do not get relief of your chest pain after 3 doses of nitro. °· Your discomfort lasts longer than 15 minutes. °MAKE SURE YOU:  °· Understand these instructions. °· Will watch your condition. °· Will get  help right away if you are not doing well or get worse. °· Take all medicines as directed by your health care provider. °Document Released: 07/26/2005 Document Revised: 07/31/2013 Document Reviewed: 11/27/2013 °ExitCare® Patient Information ©2015 [employer]. This information is not intended to replace advice given to you by your health care provider. Make sure you discuss any questions you have with your health care provider. °Coronary Angiogram with Stent °Coronary angiography with stent placement is a procedure to widen or open a narrow blood vessel of the heart (coronary artery). When a coronary artery becomes partially blocked, it decreases blood flow to that area. This may lead to chest pain or a heart attack (myocardial infarction). Arteries may become blocked by cholesterol buildup (plaque) in the lining or wall.  °A stent is a small piece of metal that looks like a mesh or a spring. Stent placement may be done right after a coronary angiography in which a blocked artery is found or as a treatment for a heart attack.  °LET YOUR HEALTH CARE PROVIDER KNOW ABOUT: °· Any allergies you have.   °· All medicines you are taking, including vitamins, herbs, eye drops, creams, and over-the-counter medicines.   °· Previous problems you or members of your family have had with the use of anesthetics.   °· Any blood disorders you have.   °· Previous surgeries you have had.   °· Medical conditions you have. °RISKS AND COMPLICATIONS °Generally, coronary angiography with stent is a safe procedure. However, problems can occur and include: °· Damage to the heart or its blood vessels.   °· A return of blockage.   °· Bleeding, infection, or bruising at the insertion site.   °· A collection of blood under the skin (hematoma) at the insertion site. °· Blood clot in another part of the body.   °· Kidney injury.   °· Allergic reaction to the dye or contrast used.   °· Bleeding into the abdomen (retroperitoneal bleeding). °BEFORE  THE PROCEDURE °· Do not eat or drink anything after midnight on the night before the procedure or as directed by your health care provider.  °· Ask your health care provider about changing or stopping your regular medicines. This is especially important if you are taking diabetes medicines or blood thinners. °· Your health care provider will make sure you understand the procedure as well as the risks and potential problems associated with the procedure.   °PROCEDURE °· You may be given a medicine to help you relax before and during the procedure (sedative). This medicine will be given through an IV tube that is put into one of your veins.   °· The area where the catheter will be inserted will be shaved and cleaned. This is usually done in the groin but may be done in the fold of your arm (near your elbow) or in the wrist.    °· A medicine will be given to numb the area where the catheter will be inserted (local anesthetic).   °· The catheter will be inserted into an artery using a guide wire. A type of X-ray (fluoroscopy) will be used to help guide the catheter to the opening of the blocked   artery.   °· A dye will then be injected into the catheter, and X-rays will be taken. The dye will help to show where any narrowing or blockages are located in the heart arteries.   °· A tiny wire will be guided to the blocked spot, and a balloon will be inflated to make the artery wider. The stent will be expanded and will crush the plaque into the wall of the vessel. The stent will hold the area open like a scaffolding and improve the blood flow.   °· Sometimes the artery may be made wider using a laser or other tools to remove plaque.   °· When the blood flow is better, the catheter will be removed. The lining of the artery will grow over the stent, which stays where it was placed.   °AFTER THE PROCEDURE °· If the procedure is done through the leg, you will be kept in bed lying flat for about 6 hours. You will be instructed to  not bend or cross your legs.   °· The insertion site will be checked frequently.   °· The pulse in your feet or wrist will be checked frequently.   °· Additional blood tests, X-rays, and electrocardiography may be done. °Document Released: 01/30/2003 Document Revised: 12/10/2013 Document Reviewed: 02/01/2013 °ExitCare® Patient Information ©2015 [employer]. This information is not intended to replace advice given to you by your health care provider. Make sure you discuss any questions you have with your health care provider. ° °

## 2014-03-07 NOTE — Progress Notes (Signed)
Pt feeling well. Discussed ex gl and NTG. Voiced understanding and ready for lifestyle change.  [id number] Ethelda Chick CES, ACSM 10:33 AM 03/07/2014

## 2014-03-07 NOTE — Progress Notes (Signed)
Patient discharged home via car, prescriptions given to patient, iv out, family at bedside, patient educated on ACS, able to apply and demonstrate education with medication, no c/o pain, vs stable

## 2014-07-18 ENCOUNTER — Encounter (HOSPITAL_COMMUNITY): Payer: Self-pay | Admitting: Cardiology

## 2014-09-13 ENCOUNTER — Other Ambulatory Visit (HOSPITAL_COMMUNITY): Payer: Self-pay | Admitting: Cardiology

## 2014-09-13 DIAGNOSIS — R0789 Other chest pain: Secondary | ICD-10-CM

## 2014-09-20 ENCOUNTER — Encounter (HOSPITAL_COMMUNITY)
Admission: RE | Admit: 2014-09-20 | Discharge: 2014-09-20 | Disposition: A | Discharge status: Home / Self Care | Payer: BLUE CROSS/BLUE SHIELD | Source: Ambulatory Visit | Attending: Cardiology | Admitting: Cardiology

## 2014-09-20 ENCOUNTER — Other Ambulatory Visit: Payer: Self-pay

## 2014-09-20 DIAGNOSIS — R079 Chest pain, unspecified: Secondary | ICD-10-CM | POA: Diagnosis present

## 2014-09-20 DIAGNOSIS — R0789 Other chest pain: Secondary | ICD-10-CM

## 2014-09-20 LAB — HEPATIC FUNCTION PANEL
ALBUMIN: 4 g/dL (ref 3.5–5.2)
ALK PHOS: 52 U/L (ref 39–117)
ALT: 18 U/L (ref 0–35)
AST: 22 U/L (ref 0–37)
Bilirubin, Direct: 0.1 mg/dL (ref 0.0–0.5)
Indirect Bilirubin: 0.6 mg/dL (ref 0.3–0.9)
Total Bilirubin: 0.7 mg/dL (ref 0.3–1.2)
Total Protein: 7.2 g/dL (ref 6.0–8.3)

## 2014-09-20 LAB — BASIC METABOLIC PANEL
Anion gap: 7 (ref 5–15)
BUN: 12 mg/dL (ref 6–23)
CALCIUM: 9.3 mg/dL (ref 8.4–10.5)
CO2: 26 mmol/L (ref 19–32)
CREATININE: 0.61 mg/dL (ref 0.50–1.10)
Chloride: 106 mmol/L (ref 96–112)
GFR calc Af Amer: >90 mL/min (ref >90)
GLUCOSE: 114 mg/dL — AB (ref 70–99)
Potassium: 4.1 mmol/L (ref 3.5–5.1)
SODIUM: 139 mmol/L (ref 135–145)

## 2014-09-20 LAB — LIPID PANEL
CHOL/HDL RATIO: 7 ratio
CHOLESTEROL: 281 mg/dL — AB (ref 0–200)
HDL: 40 mg/dL (ref >39)
LDL Cholesterol: 201 mg/dL — ABNORMAL HIGH (ref 0–99)
Triglycerides: 200 mg/dL — ABNORMAL HIGH (ref <150)
VLDL: 40 mg/dL (ref 0–40)

## 2014-09-20 MED ORDER — TECHNETIUM TC 99M SESTAMIBI GENERIC - CARDIOLITE
30.0000 | Freq: Once | INTRAVENOUS | Status: AC | PRN
Start: 2014-09-20 — End: 2014-09-20
  Administered 2014-09-20: 30 via INTRAVENOUS

## 2014-09-20 MED ORDER — TECHNETIUM TC 99M SESTAMIBI GENERIC - CARDIOLITE
10.0000 | Freq: Once | INTRAVENOUS | Status: AC | PRN
Start: 2014-09-20 — End: 2014-09-20
  Administered 2014-09-20: 10 via INTRAVENOUS

## 2014-09-20 MED ORDER — REGADENOSON 0.4 MG/5ML IV SOLN
INTRAVENOUS | Status: AC
  Administered 2014-09-20: 0.4 mg
  Filled 2014-09-20: qty 5

## 2014-09-20 MED ORDER — REGADENOSON 0.4 MG/5ML IV SOLN: 0.4000 mg | Freq: Once | INTRAVENOUS | Status: DC

## 2014-09-21 LAB — HEMOGLOBIN A1C
Hgb A1c MFr Bld: 6.9 % — ABNORMAL HIGH (ref 4.8–5.6)
MEAN PLASMA GLUCOSE: 151 mg/dL

## 2015-08-07 HISTORY — PX: COLONOSCOPY: SHX174

## 2016-06-27 ENCOUNTER — Encounter (HOSPITAL_COMMUNITY): Payer: Self-pay

## 2016-06-27 ENCOUNTER — Emergency Department (HOSPITAL_COMMUNITY): Payer: BLUE CROSS/BLUE SHIELD

## 2016-06-27 ENCOUNTER — Emergency Department (HOSPITAL_COMMUNITY)
Admission: EM | Admit: 2016-06-27 | Discharge: 2016-06-27 | Disposition: A | Discharge status: Home / Self Care | Payer: BLUE CROSS/BLUE SHIELD | Attending: Emergency Medicine | Admitting: Emergency Medicine

## 2016-06-27 DIAGNOSIS — R079 Chest pain, unspecified: Secondary | ICD-10-CM | POA: Diagnosis not present

## 2016-06-27 DIAGNOSIS — E119 Type 2 diabetes mellitus without complications: Secondary | ICD-10-CM | POA: Diagnosis not present

## 2016-06-27 DIAGNOSIS — I10 Essential (primary) hypertension: Secondary | ICD-10-CM | POA: Diagnosis not present

## 2016-06-27 DIAGNOSIS — Z7982 Long term (current) use of aspirin: Secondary | ICD-10-CM | POA: Insufficient documentation

## 2016-06-27 DIAGNOSIS — I252 Old myocardial infarction: Secondary | ICD-10-CM | POA: Insufficient documentation

## 2016-06-27 DIAGNOSIS — Z79899 Other long term (current) drug therapy: Secondary | ICD-10-CM | POA: Diagnosis not present

## 2016-06-27 HISTORY — DX: Essential (primary) hypertension: I10

## 2016-06-27 HISTORY — DX: Acute myocardial infarction, unspecified: I21.9

## 2016-06-27 LAB — BASIC METABOLIC PANEL
Anion gap: 7 (ref 5–15)
BUN: 18 mg/dL (ref 6–20)
CHLORIDE: 106 mmol/L (ref 101–111)
CO2: 25 mmol/L (ref 22–32)
CREATININE: 0.79 mg/dL (ref 0.44–1.00)
Calcium: 9.2 mg/dL (ref 8.9–10.3)
GFR calc non Af Amer: >60 mL/min (ref >60)
Glucose, Bld: 152 mg/dL — ABNORMAL HIGH (ref 65–99)
Potassium: 3.9 mmol/L (ref 3.5–5.1)
Sodium: 138 mmol/L (ref 135–145)

## 2016-06-27 LAB — CBC
HEMATOCRIT: 40.9 % (ref 36.0–46.0)
Hemoglobin: 14 g/dL (ref 12.0–15.0)
MCH: 30.2 pg (ref 26.0–34.0)
MCHC: 34.2 g/dL (ref 30.0–36.0)
MCV: 88.1 fL (ref 78.0–100.0)
PLATELETS: 176 10*3/uL (ref 150–400)
RBC: 4.64 MIL/uL (ref 3.87–5.11)
RDW: 12.9 % (ref 11.5–15.5)
WBC: 4.6 10*3/uL (ref 4.0–10.5)

## 2016-06-27 LAB — I-STAT TROPONIN, ED
Troponin i, poc: 0 ng/mL (ref 0.00–0.08)
Troponin i, poc: 0 ng/mL (ref 0.00–0.08)

## 2016-06-27 MED ORDER — ACETAMINOPHEN 500 MG PO TABS
1000.0000 mg | ORAL_TABLET | Freq: Once | ORAL | Status: AC
  Administered 2016-06-27: 1000 mg via ORAL
  Filled 2016-06-27 (×2): qty 2

## 2016-06-27 MED ORDER — ASPIRIN 81 MG PO CHEW
324.0000 mg | CHEWABLE_TABLET | Freq: Once | ORAL | Status: AC
  Administered 2016-06-27: 324 mg via ORAL
  Filled 2016-06-27: qty 4

## 2016-06-27 NOTE — ED Provider Notes (Signed)
Emergency Department Provider Note   I have reviewed the triage vital signs and the nursing notes.   HISTORY  Chief Complaint Chest Pain   HPI Kristin PolesSherrie K Crockett is a 60 y.o. female with PMH of DM, CAD, and HTN presents to the emergency department for evaluation of chest pain. Patient states that she had several seconds of central chest pain upon rolling over in bed this morning. She had some associated nausea. She felt discomfort under her left arm and in her back as well. She reports that the pain resolved spontaneously and was not obviously provoked by additional movement afterwards. She has since had several other isolated episodes of less severe pain this morning. She states it feels similar to the start of her heart attack last time. She had an acute MI several years ago that required stenting. She is currently chest pain-free. She denies associated diaphoresis or dyspnea. No abdominal pain, vomiting, diarrhea. She has not taken any of her home morning medications. No ASA PTA.   She is also complaining of a mild, gradual onset frontal headache for the past 2 days. No sudden onset maximal intensity symptoms. No fevers. No neck stiffness. No vision changes.   Past Medical History:  Diagnosis Date  . Arthritis   . Diabetes mellitus without complication (HCC)    fasting blood sugars 150s  . Heart murmur   . Hypertension   . Myocardial infarct     Patient Active Problem List   Diagnosis Date Noted  . 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 ANTERIOR CERVICAL DECOMP/DISCECTOMY FUSION N/A 11/20/2012   Procedure: ANTERIOR CERVICAL DECOMPRESSION/DISCECTOMY FUSION 1 LEVEL;  Surgeon: Hewitt Shortsobert W Nudelman, MD;  Location: [facility];  Service: Neurosurgery;  Laterality: N/A;  Cervical four - five  anterior cervical decompression with fusion plating and bonegraft  . EYE SURGERY     cataract bilteral  . LEFT HEART  CATHETERIZATION WITH CORONARY ANGIOGRAM N/A 03/05/2014   Procedure: LEFT HEART CATHETERIZATION WITH CORONARY ANGIOGRAM;  Surgeon: Robynn PaneMohan N Harwani, MD;  [facility];  Service: Cardiovascular;  Laterality: N/A;  . neck fusion    . TONSILLECTOMY      Current Outpatient Rx  . Order #: [id number] Class: Historical Med  . Order #: [id number] Class: Print  . Order #: [id number] Class: Historical Med  . Order #: [id number] Class: Historical Med  . Order #: [id number] Class: Print  . Order #: [id number] Class: Historical Med  . Order #: [id number] Class: Print  . Order #: [id number] Class: Historical Med  . Order #: [id number] Class: Print  . Order #: [id number] Class: Print  . Order #: [id number] Class: Print    Allergies Sulfa antibiotics  No family history on file.  Social History Social History  Substance Use Topics  . Smoking status: Never Smoker  . Smokeless tobacco: Never Used  . Alcohol use No    Review of Systems  Constitutional: No fever/chills Eyes: No visual changes. ENT: No sore throat. Cardiovascular: Positive chest pain. Respiratory: Denies shortness of breath. Gastrointestinal: No abdominal pain.  No nausea, no vomiting.  No diarrhea.  No constipation. Genitourinary: Negative for dysuria. Musculoskeletal: Negative for back pain. Skin: Negative for rash. Neurological: Negative for headaches, focal weakness or numbness.  10-point ROS otherwise negative.  ____________________________________________   PHYSICAL EXAM:  VITAL SIGNS: ED Triage Vitals  Enc Vitals Group     BP 06/27/16 0814 155/97     Pulse Rate 06/27/16 0814 66  Resp 06/27/16 0814 18     Temp 06/27/16 0814 98.4 F (36.9 C)     Temp Source 06/27/16 0814 Oral     SpO2 06/27/16 0814 98 %     Weight 06/27/16 0815 178 lb (80.7 kg)     Height 06/27/16 0815 5\' 5"  (1.651 m)     Pain Score 06/27/16 0813 3   Constitutional: Alert and oriented. Well appearing and in no acute distress. Eyes: Conjunctivae  are normal.  Head: Atraumatic. Nose: No congestion/rhinnorhea. Mouth/Throat: Mucous membranes are moist.  Neck: No stridor. Cardiovascular: Normal rate, regular rhythm. Good peripheral circulation. Grossly normal heart sounds.   Respiratory: Normal respiratory effort.  No retractions. Lungs CTAB. Gastrointestinal: Soft and nontender. No distention.  Musculoskeletal: No lower extremity tenderness nor edema. No gross deformities of extremities. Neurologic:  Normal speech and language. No gross focal neurologic deficits are appreciated.  Skin:  Skin is warm, dry and intact. No rash noted.   ____________________________________________   LABS (all labs ordered are listed, but only abnormal results are displayed)  Labs Reviewed  BASIC METABOLIC PANEL - Abnormal; Notable for the following:       Result Value   Glucose, Bld 152 (*)    All other components within normal limits  CBC  I-STAT TROPOININ, ED  I-STAT TROPOININ, ED   ____________________________________________  EKG   EKG Interpretation  Date/Time:  Sunday June 27 2016 08:16:08 EST Ventricular Rate:  70 PR Interval:  168 QRS Duration: 82 QT Interval:  424 QTC Calculation: 457 R Axis:   60 Text Interpretation:  Sinus rhythm with occasional Premature ventricular complexes Otherwise normal ECG No STEMI.  Confirmed by Early Ord MD, Cornelius Schuitema [phone]) on 06/27/2016 9:39:12 AM       ____________________________________________  RADIOLOGY  EXAM:  CHEST 2 VIEW    COMPARISON: 03/05/2014    FINDINGS:  The heart size and mediastinal contours are within normal limits.  Both lungs are clear. The visualized skeletal structures are  unremarkable.    IMPRESSION:  No active cardiopulmonary disease.      Electronically Signed  By: Charlett Nose M.D.  On: 06/27/2016 08:52    ____________________________________________   PROCEDURES  Procedure(s) performed:    Procedures  None ____________________________________________   INITIAL IMPRESSION / ASSESSMENT AND PLAN / ED COURSE  Pertinent labs & imaging results that were available during my care of the patient were reviewed by me and considered in my medical decision making (see chart for details).  Patient presents to the emergency department for evaluation of chest pain. The pain is rather atypical with the patient has had a series heart attack several years ago where he presented in a similar way. Plan for troponin and chest x-ray. EKG is nonspecific and shows no evidence of acute ischemia.  10:05 AM Spoke with Dr. Sharyn Lull regarding the patient's presentation. Less concerning story for ACS with only momentary, fleeting pain. No pain currently. Plan for repeat troponin and discharge home if negative to follow up in the office. Discussed plan with patient who is in agreement with plan. Currently CP free.   01:01 PM Repeat troponin negative. Patient is expressing no chest pain since arrival in the emergency department. Plan for discharge with cardiology follow-up as an outpatient.   At this time, I do not feel there is any life-threatening condition present. I have reviewed and discussed all results (EKG, imaging, lab, urine as appropriate), exam findings with patient. I have reviewed nursing notes and appropriate previous records.  I feel  the patient is safe to be discharged home without further emergent workup. Discussed usual and customary return precautions. Patient and family (if present) verbalize understanding and are comfortable with this plan.  Patient will follow-up with their primary care provider. If they do not have a primary care provider, information for follow-up has been provided to them. All questions have been answered.  ____________________________________________  FINAL CLINICAL IMPRESSION(S) / ED DIAGNOSES  Final diagnoses:  Nonspecific chest pain     MEDICATIONS GIVEN  DURING THIS VISIT:  Medications  acetaminophen (TYLENOL) tablet 1,000 mg (1,000 mg Oral Given 06/27/16 0921)  aspirin chewable tablet 324 mg (324 mg Oral Given 06/27/16 0918)     NEW OUTPATIENT MEDICATIONS STARTED DURING THIS VISIT:  None   Note:  This document was prepared using Dragon voice recognition software and may include unintentional dictation errors.  Alona Bene, MD Emergency Medicine   Maia Plan, MD 06/28/16 [phone]

## 2016-06-27 NOTE — Discharge Instructions (Signed)

## 2016-06-27 NOTE — ED Notes (Signed)
Patient transported to X-ray 

## 2016-06-27 NOTE — ED Triage Notes (Signed)
Patient complains of chest tightness, neck pain and intermittent headaches for several days. On arrival only complains of headache. Has not taken NTG for the CP. Alert and oriented, NAD

## 2016-06-30 ENCOUNTER — Other Ambulatory Visit: Payer: Self-pay | Admitting: Cardiology

## 2016-06-30 DIAGNOSIS — R079 Chest pain, unspecified: Secondary | ICD-10-CM

## 2016-07-09 ENCOUNTER — Encounter (HOSPITAL_COMMUNITY)
Admission: RE | Admit: 2016-07-09 | Discharge: 2016-07-09 | Disposition: A | Discharge status: Home / Self Care | Payer: BLUE CROSS/BLUE SHIELD | Source: Ambulatory Visit | Attending: Cardiology | Admitting: Cardiology

## 2016-07-09 DIAGNOSIS — R079 Chest pain, unspecified: Secondary | ICD-10-CM | POA: Insufficient documentation

## 2016-07-09 LAB — BASIC METABOLIC PANEL
ANION GAP: 10 (ref 5–15)
BUN: 19 mg/dL (ref 6–20)
CO2: 27 mmol/L (ref 22–32)
Calcium: 9.4 mg/dL (ref 8.9–10.3)
Chloride: 102 mmol/L (ref 101–111)
Creatinine, Ser: 0.76 mg/dL (ref 0.44–1.00)
GLUCOSE: 155 mg/dL — AB (ref 65–99)
POTASSIUM: 4.3 mmol/L (ref 3.5–5.1)
Sodium: 139 mmol/L (ref 135–145)

## 2016-07-09 LAB — HEPATIC FUNCTION PANEL
ALBUMIN: 4 g/dL (ref 3.5–5.0)
ALT: 20 U/L (ref 14–54)
AST: 19 U/L (ref 15–41)
Alkaline Phosphatase: 54 U/L (ref 38–126)
Total Bilirubin: 0.7 mg/dL (ref 0.3–1.2)
Total Protein: 7.1 g/dL (ref 6.5–8.1)

## 2016-07-09 LAB — LIPID PANEL
Cholesterol: 226 mg/dL — ABNORMAL HIGH (ref 0–200)
HDL: 31 mg/dL — AB (ref >40)
LDL CALC: 147 mg/dL — AB (ref 0–99)
Total CHOL/HDL Ratio: 7.3 RATIO
Triglycerides: 238 mg/dL — ABNORMAL HIGH (ref <150)
VLDL: 48 mg/dL — AB (ref 0–40)

## 2016-07-09 LAB — HEMOGLOBIN A1C
HEMOGLOBIN A1C: 7.6 % — AB (ref 4.8–5.6)
Mean Plasma Glucose: 171 mg/dL

## 2016-07-09 MED ORDER — TECHNETIUM TC 99M TETROFOSMIN IV KIT
30.0000 | PACK | Freq: Once | INTRAVENOUS | Status: AC | PRN
  Administered 2016-07-09: 30 via INTRAVENOUS

## 2016-07-09 MED ORDER — REGADENOSON 0.4 MG/5ML IV SOLN
INTRAVENOUS | Status: AC
  Filled 2016-07-09: qty 5

## 2016-07-09 MED ORDER — REGADENOSON 0.4 MG/5ML IV SOLN
0.4000 mg | Freq: Once | INTRAVENOUS | Status: AC
  Administered 2016-07-09: 0.4 mg via INTRAVENOUS

## 2016-07-09 MED ORDER — TECHNETIUM TC 99M TETROFOSMIN IV KIT
10.0000 | PACK | Freq: Once | INTRAVENOUS | Status: AC | PRN
  Administered 2016-07-09: 10 via INTRAVENOUS

## 2017-05-11 ENCOUNTER — Other Ambulatory Visit: Payer: Self-pay | Admitting: Obstetrics & Gynecology

## 2017-05-11 DIAGNOSIS — R928 Other abnormal and inconclusive findings on diagnostic imaging of breast: Secondary | ICD-10-CM

## 2017-05-18 ENCOUNTER — Ambulatory Visit: Payer: BLUE CROSS/BLUE SHIELD

## 2017-05-18 ENCOUNTER — Ambulatory Visit
Admission: RE | Admit: 2017-05-18 | Discharge: 2017-05-18 | Disposition: A | Discharge status: Home / Self Care | Payer: BLUE CROSS/BLUE SHIELD | Source: Ambulatory Visit | Attending: Obstetrics & Gynecology | Admitting: Obstetrics & Gynecology

## 2017-05-18 DIAGNOSIS — R928 Other abnormal and inconclusive findings on diagnostic imaging of breast: Secondary | ICD-10-CM

## 2018-02-16 ENCOUNTER — Emergency Department (HOSPITAL_COMMUNITY): Payer: BLUE CROSS/BLUE SHIELD

## 2018-02-16 ENCOUNTER — Encounter (HOSPITAL_COMMUNITY): Payer: Self-pay

## 2018-02-16 ENCOUNTER — Observation Stay (HOSPITAL_COMMUNITY)
Admission: EM | Admit: 2018-02-16 | Discharge: 2018-02-17 | Disposition: A | Discharge status: Home / Self Care | Payer: BLUE CROSS/BLUE SHIELD | Attending: Cardiology | Admitting: Cardiology

## 2018-02-16 ENCOUNTER — Other Ambulatory Visit: Payer: Self-pay

## 2018-02-16 DIAGNOSIS — R079 Chest pain, unspecified: Principal | ICD-10-CM | POA: Insufficient documentation

## 2018-02-16 DIAGNOSIS — E119 Type 2 diabetes mellitus without complications: Secondary | ICD-10-CM | POA: Diagnosis not present

## 2018-02-16 DIAGNOSIS — I10 Essential (primary) hypertension: Secondary | ICD-10-CM | POA: Diagnosis not present

## 2018-02-16 DIAGNOSIS — R55 Syncope and collapse: Secondary | ICD-10-CM | POA: Diagnosis present

## 2018-02-16 DIAGNOSIS — Z79899 Other long term (current) drug therapy: Secondary | ICD-10-CM | POA: Diagnosis not present

## 2018-02-16 DIAGNOSIS — Z7982 Long term (current) use of aspirin: Secondary | ICD-10-CM | POA: Insufficient documentation

## 2018-02-16 DIAGNOSIS — I209 Angina pectoris, unspecified: Secondary | ICD-10-CM | POA: Diagnosis present

## 2018-02-16 DIAGNOSIS — I200 Unstable angina: Secondary | ICD-10-CM | POA: Diagnosis present

## 2018-02-16 HISTORY — DX: Personal history of other diseases of the digestive system: Z87.19

## 2018-02-16 HISTORY — DX: Pure hypercholesterolemia, unspecified: E78.00

## 2018-02-16 HISTORY — DX: Gastro-esophageal reflux disease without esophagitis: K21.9

## 2018-02-16 HISTORY — DX: Type 2 diabetes mellitus without complications: E11.9

## 2018-02-16 HISTORY — DX: Personal history of urinary calculi: Z87.442

## 2018-02-16 LAB — CBC WITH DIFFERENTIAL/PLATELET
Abs Immature Granulocytes: 0 10*3/uL (ref 0.0–0.1)
BASOS ABS: 0 10*3/uL (ref 0.0–0.1)
BASOS PCT: 1 %
EOS PCT: 2 %
Eosinophils Absolute: 0.1 10*3/uL (ref 0.0–0.7)
HCT: 43.3 % (ref 36.0–46.0)
HEMOGLOBIN: 14.4 g/dL (ref 12.0–15.0)
Immature Granulocytes: 0 %
Lymphocytes Relative: 28 %
Lymphs Abs: 1.6 10*3/uL (ref 0.7–4.0)
MCH: 30.4 pg (ref 26.0–34.0)
MCHC: 33.3 g/dL (ref 30.0–36.0)
MCV: 91.5 fL (ref 78.0–100.0)
MONO ABS: 0.4 10*3/uL (ref 0.1–1.0)
Monocytes Relative: 7 %
Neutro Abs: 3.8 10*3/uL (ref 1.7–7.7)
Neutrophils Relative %: 62 %
PLATELETS: 177 10*3/uL (ref 150–400)
RBC: 4.73 MIL/uL (ref 3.87–5.11)
RDW: 12.7 % (ref 11.5–15.5)
WBC: 6 10*3/uL (ref 4.0–10.5)

## 2018-02-16 LAB — BASIC METABOLIC PANEL
Anion gap: 9 (ref 5–15)
BUN: 21 mg/dL (ref 8–23)
CALCIUM: 8.9 mg/dL (ref 8.9–10.3)
CO2: 23 mmol/L (ref 22–32)
Chloride: 109 mmol/L (ref 98–111)
Creatinine, Ser: 1.03 mg/dL — ABNORMAL HIGH (ref 0.44–1.00)
GFR calc Af Amer: >60 mL/min (ref >60)
GFR, EST NON AFRICAN AMERICAN: 57 mL/min — AB (ref >60)
GLUCOSE: 159 mg/dL — AB (ref 70–99)
Potassium: 3.9 mmol/L (ref 3.5–5.1)
SODIUM: 141 mmol/L (ref 135–145)

## 2018-02-16 LAB — HEMOGLOBIN A1C
HEMOGLOBIN A1C: 8 % — AB (ref 4.8–5.6)
Mean Plasma Glucose: 182.9 mg/dL

## 2018-02-16 LAB — GLUCOSE, CAPILLARY: Glucose-Capillary: 150 mg/dL — ABNORMAL HIGH (ref 70–99)

## 2018-02-16 LAB — TROPONIN I

## 2018-02-16 LAB — I-STAT TROPONIN, ED: TROPONIN I, POC: 0 ng/mL (ref 0.00–0.08)

## 2018-02-16 LAB — CBG MONITORING, ED: GLUCOSE-CAPILLARY: 138 mg/dL — AB (ref 70–99)

## 2018-02-16 LAB — HEPARIN LEVEL (UNFRACTIONATED): Heparin Unfractionated: 0.61 IU/mL (ref 0.30–0.70)

## 2018-02-16 MED ORDER — ONDANSETRON HCL 4 MG/2ML IJ SOLN: 4.0000 mg | Freq: Four times a day (QID) | INTRAMUSCULAR | Status: DC | PRN

## 2018-02-16 MED ORDER — EZETIMIBE 10 MG PO TABS
10.0000 mg | ORAL_TABLET | Freq: Every day | ORAL | Status: DC
  Administered 2018-02-17: 10 mg via ORAL
  Filled 2018-02-16: qty 1

## 2018-02-16 MED ORDER — HEPARIN (PORCINE) IN NACL 100-0.45 UNIT/ML-% IJ SOLN
1200.0000 [IU]/h | INTRAMUSCULAR | Status: DC
  Administered 2018-02-16 – 2018-02-17 (×2): 1200 [IU]/h via INTRAVENOUS
  Filled 2018-02-16 (×2): qty 250

## 2018-02-16 MED ORDER — SODIUM CHLORIDE 0.9 % IV SOLN
INTRAVENOUS | Status: DC
  Administered 2018-02-16 – 2018-02-17 (×2): via INTRAVENOUS

## 2018-02-16 MED ORDER — METOPROLOL TARTRATE 12.5 MG HALF TABLET
12.5000 mg | ORAL_TABLET | Freq: Two times a day (BID) | ORAL | Status: DC
  Administered 2018-02-16 – 2018-02-17 (×2): 12.5 mg via ORAL
  Filled 2018-02-16 (×2): qty 1

## 2018-02-16 MED ORDER — ACETAMINOPHEN 325 MG PO TABS
650.0000 mg | ORAL_TABLET | ORAL | Status: DC | PRN
  Administered 2018-02-16: 650 mg via ORAL
  Filled 2018-02-16: qty 2

## 2018-02-16 MED ORDER — INSULIN ASPART 100 UNIT/ML ~~LOC~~ SOLN
0.0000 [IU] | Freq: Three times a day (TID) | SUBCUTANEOUS | Status: DC
  Administered 2018-02-17: 1 [IU] via SUBCUTANEOUS

## 2018-02-16 MED ORDER — INSULIN GLARGINE 100 UNIT/ML ~~LOC~~ SOLN
10.0000 [IU] | Freq: Every day | SUBCUTANEOUS | Status: DC
  Administered 2018-02-16: 10 [IU] via SUBCUTANEOUS
  Filled 2018-02-16 (×2): qty 0.1

## 2018-02-16 MED ORDER — ASPIRIN 300 MG RE SUPP: 300.0000 mg | RECTAL | Status: AC

## 2018-02-16 MED ORDER — ASPIRIN EC 81 MG PO TBEC
81.0000 mg | DELAYED_RELEASE_TABLET | Freq: Every day | ORAL | Status: DC
  Administered 2018-02-17: 81 mg via ORAL
  Filled 2018-02-16: qty 1

## 2018-02-16 MED ORDER — NITROGLYCERIN 0.4 MG SL SUBL
0.4000 mg | SUBLINGUAL_TABLET | SUBLINGUAL | Status: DC | PRN
  Administered 2018-02-16: 0.4 mg via SUBLINGUAL
  Filled 2018-02-16: qty 1

## 2018-02-16 MED ORDER — ASPIRIN 81 MG PO CHEW
324.0000 mg | CHEWABLE_TABLET | ORAL | Status: AC
  Administered 2018-02-16: 243 mg via ORAL
  Filled 2018-02-16: qty 4

## 2018-02-16 MED ORDER — NITROGLYCERIN 0.4 MG SL SUBL: 0.4000 mg | SUBLINGUAL_TABLET | SUBLINGUAL | Status: DC | PRN

## 2018-02-16 MED ORDER — NITROGLYCERIN 2 % TD OINT
0.5000 [in_us] | TOPICAL_OINTMENT | Freq: Three times a day (TID) | TRANSDERMAL | Status: DC
  Administered 2018-02-16 – 2018-02-17 (×2): 0.5 [in_us] via TOPICAL
  Filled 2018-02-16: qty 30

## 2018-02-16 MED ORDER — HEPARIN BOLUS VIA INFUSION
4000.0000 [IU] | Freq: Once | INTRAVENOUS | Status: AC
  Administered 2018-02-16: 4000 [IU] via INTRAVENOUS
  Filled 2018-02-16: qty 4000

## 2018-02-16 NOTE — H&P (Signed)
[name] is an 62 y.o. female.   Chief Complaint: chest pain radiating to the side of the left arm associated with dizziness and near syncopal episode [name] is 62 year old female with past medical history significant for coronary artery disease history of anteroseptal wall myocardial infarction in July 2015 requiring PTCA stenting to proximal and mid LAD, hypertension, hyperlipidemia, insulin-requiring diabetes mellitus, positive family history of coronary artery disease, came to the ER by EMS while working in the yard patient suddenly became dizzy and diaphoretic associated with nausea and had near syncopal episode patient denies any slurred speech weakness in the arms or legs while in the ED patient developed retrosternal chest pain grade 2/10 radiating to the inner side of the left arm received one sublingual nitroglycerin with relief.patient denies any palpitations or syncopal episode. Denies any recent cardiac workup.   Past Medical History:  Diagnosis Date  . Arthritis   . Diabetes mellitus without complication (HCC)    fasting blood sugars 150s  . Heart murmur   . Hypertension   . Myocardial infarct [facility])     Past Surgical History:  Procedure Laterality Date  . ANTERIOR CERVICAL DECOMP/DISCECTOMY FUSION N/A 11/20/2012   Procedure: ANTERIOR CERVICAL DECOMPRESSION/DISCECTOMY FUSION 1 LEVEL;  Surgeon: Hosie Spangle, MD;  Location: [place] [facility];  Service: Neurosurgery;  Laterality: N/A;  Cervical four - five  anterior cervical decompression with fusion plating and bonegraft  . EYE SURGERY     cataract bilteral  . LEFT HEART CATHETERIZATION WITH CORONARY ANGIOGRAM N/A 03/05/2014   Procedure: LEFT HEART CATHETERIZATION WITH CORONARY ANGIOGRAM;  Surgeon: Clent Demark, MD;  Location: [facility] CATH LAB;  Service: Cardiovascular;  Laterality: N/A;  . neck fusion    . TONSILLECTOMY      History reviewed. No pertinent family history. Social History:  reports that she has never  smoked. She has never used smokeless tobacco. She reports that she does not drink alcohol or use drugs.  Allergies:  Allergies  Allergen Reactions  . Sulfa Antibiotics Other (See Comments)    Blisters on nose     (Not in a hospital admission)  Results for orders placed or performed during the hospital encounter of 02/16/18 (from the past 48 hour(s))  Basic metabolic panel     Status: Abnormal   Collection Time: 02/16/18  1:50 PM  Result Value Ref Range   Sodium 141 135 - 145 mmol/L   Potassium 3.9 3.5 - 5.1 mmol/L   Chloride 109 98 - 111 mmol/L    Comment: Please note change in reference range.   CO2 23 22 - 32 mmol/L   Glucose, Bld 159 (H) 70 - 99 mg/dL    Comment: Please note change in reference range.   BUN 21 8 - 23 mg/dL    Comment: Please note change in reference range.   Creatinine, Ser 1.03 (H) 0.44 - 1.00 mg/dL   Calcium 8.9 8.9 - 10.3 mg/dL   GFR calc non Af Amer 57 (L) >60 mL/min   GFR calc Af Amer >60 >60 mL/min    Comment: (NOTE) The eGFR has been calculated using the CKD EPI equation. This calculation has not been validated in all clinical situations. eGFR's persistently <60 mL/min signify possible Chronic Kidney Disease.    Anion gap 9 5 - 15    Comment: Performed at [facility] [address] [place], [place] [address]  CBC with Differential     Status: None   Collection Time: 02/16/18  1:50 PM  Result Value Ref Range   WBC 6.0 4.0 - 10.5 K/uL   RBC 4.73 3.87 - 5.11 MIL/uL   Hemoglobin 14.4 12.0 - 15.0 g/dL   HCT 43.3 36.0 - 46.0 %   MCV 91.5 78.0 - 100.0 fL   MCH 30.4 26.0 - 34.0 pg   MCHC 33.3 30.0 - 36.0 g/dL   RDW 12.7 11.5 - 15.5 %   Platelets 177 150 - 400 K/uL   Neutrophils Relative % 62 %   Neutro Abs 3.8 1.7 - 7.7 K/uL   Lymphocytes Relative 28 %   Lymphs Abs 1.6 0.7 - 4.0 K/uL   Monocytes Relative 7 %   Monocytes Absolute 0.4 0.1 - 1.0 K/uL   Eosinophils Relative 2 %   Eosinophils Absolute 0.1 0.0 - 0.7 K/uL   Basophils  Relative 1 %   Basophils Absolute 0.0 0.0 - 0.1 K/uL   Immature Granulocytes 0 %   Abs Immature Granulocytes 0.0 0.0 - 0.1 K/uL    Comment: Performed at [facility], [address], [place],  [address]  I-stat troponin, ED     Status: None   Collection Time: 02/16/18  3:12 PM  Result Value Ref Range   Troponin i, poc 0.00 0.00 - 0.08 ng/mL   Comment 3            Comment: Due to the release kinetics of cTnI, a negative result within the first hours of the onset of symptoms does not rule out myocardial infarction with certainty. If myocardial infarction is still suspected, repeat the test at appropriate intervals.   POC CBG, ED     Status: Abnormal   Collection Time: 02/16/18  3:51 PM  Result Value Ref Range   Glucose-Capillary 138 (H) 70 - 99 mg/dL   Dg Chest 2 View  Result Date: 02/16/2018 CLINICAL DATA:  62 year old female with a history of syncope EXAM: CHEST - 2 VIEW COMPARISON:  06/27/2016 03/05/2014 FINDINGS: Cardiomediastinal silhouette unchanged in size and contour. No evidence of central vascular congestion. No pneumothorax or pleural effusion. No confluent airspace disease. Surgical changes of the cervical region. No displaced fracture. IMPRESSION: Negative for acute cardiopulmonary disease Electronically Signed   By: Corrie Mckusick D.O.   On: 02/16/2018 15:46    Review of Systems  Constitutional: Positive for diaphoresis. Negative for chills and fever.  HENT: Negative for hearing loss.   Eyes: Negative for blurred vision.  Respiratory: Negative for cough and shortness of breath.   Cardiovascular: Positive for chest pain. Negative for palpitations and orthopnea.  Gastrointestinal: Positive for nausea. Negative for abdominal pain and vomiting.  Genitourinary: Negative for dysuria.  Skin: Negative for rash.  Neurological: Positive for dizziness.    Blood pressure 113/74, pulse 65, resp. rate 14, height _0  (1.651 m), weight 80.7 kg (178 lb), SpO2 96  %. Physical Exam  Constitutional: She is oriented to person, place, and time.  HENT:  Head: Normocephalic and atraumatic.  Eyes: Pupils are equal, round, and reactive to light. Conjunctivae are normal. Left eye exhibits no discharge. No scleral icterus.  Neck: Normal range of motion. Neck supple. No JVD present. No tracheal deviation present. No thyromegaly present.  Cardiovascular: Normal rate and regular rhythm.  Murmur (Soft systolic murmur noted no S3 gallop) heard. Respiratory: Effort normal and breath sounds normal. No respiratory distress. She has no wheezes. She has no rales.  GI: Soft. Bowel sounds are normal. She exhibits no distension. There is no tenderness.  There is no rebound.  Musculoskeletal: She exhibits no edema, tenderness or deformity.  Neurological: She is alert and oriented to person, place, and time.     Assessment/Plan Chest pain/left arm pain worrisome for angina rule out progression of CAD Status post near-syncope Coronary artery disease status post anteroseptal wall MI in July 2015 status post PCI to LAD in the past Hypertension Hyperlipidemia Insulin requiring diabetes mellitus  Positive family history of coronary artery disease PLAN As per orders  Charolette Forward, MD 02/16/2018, 5:01 PM

## 2018-02-16 NOTE — ED Triage Notes (Signed)
Pt arrives to ED from home with complaints of near syncopal episode since this afternoon after approx 90 mins of yard work. EMS reports pt did not pass out, but did experience weakness, diaphoresis, jaw pain, and "saw a bright light". Pt is A&Ox4, hx of MI and cardiac stents 4 years ago. EMS states pt has condition in which she does not sweat, only other time pt has experienced sweating was 4 years ago when she had her MI and today. Pt placed in position of comfort with bed locked and lowered, call bell in reach.

## 2018-02-16 NOTE — ED Provider Notes (Signed)
[facility] EMERGENCY DEPARTMENT Provider Note   CSN: [id number] Arrival date & time: 02/16/18  1439     History   Chief Complaint Chief Complaint  Patient presents with  . Near Syncope    HPI [name] is a 62 y.o. female who presents with near syncope and chest pain.  Past medical history significant for coronary artery disease, MI, diabetes.  She states that she was shoveling mulch in the yard and a neighbor came up to talk to her around 12:30PM. She saw very bright light, spots, became diaphoretic and very nauseous and like her vision was going out. Bystanders called EMS. She did not lose consciousness.  She had no chest pain during the episode however since she is been in the emergency department she states that she did develop some mild substernal chest pain at around 3:30 today as well as left arm pain.  She states that is a constant aching and it radiates to her back.  Her prior MI felt like an elephant sitting on her chest but she states that this pain feels like how it did leading up to her MI.  Currently her light headedness is improved after she received fluids by EMS.  No fever, SOB, cough, abdominal pain, vomiting, leg swelling. No recent surgery/travel/immobilization, hx of cancer, leg swelling, hemptysis, prior DVT/PE, or hormone use. Her cardiologist is Dr. Sharyn Lull.  HPI  Past Medical History:  Diagnosis Date  . Arthritis   . Diabetes mellitus without complication (HCC)    fasting blood sugars 150s  . Heart murmur   . Hypertension   . Myocardial infarct [facility])     Patient Active Problem List   Diagnosis Date Noted  . 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 ANTERIOR CERVICAL DECOMP/DISCECTOMY FUSION N/A 11/20/2012   Procedure: ANTERIOR CERVICAL DECOMPRESSION/DISCECTOMY FUSION 1 LEVEL;  Surgeon: Hewitt Shorts, MD;  Location: [facility]UR[facility]S;  Service: Neurosurgery;  Laterality:  N/A;  Cervical four - five  anterior cervical decompression with fusion plating and bonegraft  . EYE SURGERY     cataract bilteral  . LEFT HEART CATHETERIZATION WITH CORONARY ANGIOGRAM N/A 03/05/2014   Procedure: LEFT HEART CATHETERIZATION WITH CORONARY ANGIOGRAM;  Surgeon: Robynn Pane, MD;  Location: [facility] C[facility] LAB;  Service: Cardiovascular;  Laterality: N/A;  . neck fusion    . TONSILLECTOMY       OB History   None      Home Medications    Prior to Admission medications   Medication Sig Start Date End Date Taking? Authorizing Provider  amLODipine (NORVASC) 2.5 MG tablet Take 2.5 mg by mouth daily.   Yes [provider]  aspirin EC 81 MG EC tablet Take 1 tablet (81 mg total) by mouth daily. 03/07/14  Yes Rinaldo Cloud, MD  insulin glargine (LANTUS) 100 UNIT/ML injection Inject 42 Units into the skin at bedtime. Based on sugar levels   Yes [provider]  metoprolol tartrate (LOPRESSOR) 12.5 mg TABS tablet Take 0.5 tablets (12.5 mg total) by mouth 2 (two) times daily. 03/07/14  Yes Rinaldo Cloud, MD  nitroGLYCERIN (NITROSTAT) 0.4 MG SL tablet Place 1 tablet (0.4 mg total) under the tongue every 5 (five) minutes as needed for chest pain. 03/07/14  Yes Rinaldo Cloud, MD  ramipril (ALTACE) 5 MG capsule Take 1 capsule (5 mg total) by mouth daily. Patient taking differently: Take 10 mg by mouth daily.  03/07/14  Yes Rinaldo Cloud, MD  Vitamin D, Ergocalciferol, (DRISDOL) 50000 units CAPS capsule Take 50,000 Units by mouth once a week. On saturdays 11/22/17  Yes [provider]  atorvastatin (LIPITOR) 80 MG tablet Take 1 tablet (80 mg total) by mouth daily at 6 PM. Patient not taking: Reported on 06/27/2016 03/07/14   Rinaldo Cloud, MD  ticagrelor (BRILINTA) 90 MG TABS tablet Take 1 tablet (90 mg total) by mouth 2 (two) times daily. Patient not taking: Reported on 06/27/2016 03/07/14   Rinaldo Cloud, MD    Family History History reviewed. No pertinent family  history.  Social History Social History   Tobacco Use  . Smoking status: Never Smoker  . Smokeless tobacco: Never Used  Substance Use Topics  . Alcohol use: No  . Drug use: No     Allergies   Sulfa antibiotics   Review of Systems Review of Systems  Constitutional: Negative for fever.  Eyes: Positive for visual disturbance.  Respiratory: Negative for shortness of breath.   Cardiovascular: Positive for chest pain.  Gastrointestinal: Positive for nausea. Negative for abdominal pain and vomiting.  Neurological: Positive for light-headedness. Negative for syncope.  All other systems reviewed and are negative.    Physical Exam Updated Vital Signs BP 116/76   Pulse 62   Resp 12   Ht 5\' 5"  (1.651 m)   Wt 80.7 kg (178 lb)   SpO2 95%   BMI 29.62 kg/m   Physical Exam  Constitutional: She is oriented to person, place, and time. She appears well-developed and well-nourished. No distress.  HENT:  Head: Normocephalic and atraumatic.  Eyes: Pupils are equal, round, and reactive to light. Conjunctivae are normal. Right eye exhibits no discharge. Left eye exhibits no discharge. No scleral icterus.  Neck: Normal range of motion.  Cardiovascular: Normal rate and regular rhythm.  Pulmonary/Chest: Effort normal and breath sounds normal. No respiratory distress.  Abdominal: Soft. Bowel sounds are normal. She exhibits no distension. There is no tenderness.  Musculoskeletal:  No peripheral edema  Neurological: She is alert and oriented to person, place, and time.  Skin: Skin is warm and dry.  Psychiatric: She has a normal mood and affect. Her behavior is normal.  Nursing note and vitals reviewed.    ED Treatments / Results  Labs (all labs ordered are listed, but only abnormal results are displayed) Labs Reviewed  BASIC METABOLIC PANEL - Abnormal; Notable for the following components:      Result Value   Glucose, Bld 159 (*)    Creatinine, Ser 1.03 (*)    GFR calc non Af Amer  57 (*)    All other components within normal limits  CBG MONITORING, ED - Abnormal; Notable for the following components:   Glucose-Capillary 138 (*)    All other components within normal limits  CBC WITH DIFFERENTIAL/PLATELET  I-STAT TROPONIN, ED    EKG None  Radiology Dg Chest 2 View  Result Date: 02/16/2018 CLINICAL DATA:  62 year old female with a history of syncope EXAM: CHEST - 2 VIEW COMPARISON:  06/27/2016 03/05/2014 FINDINGS: Cardiomediastinal silhouette unchanged in size and contour. No evidence of central vascular congestion. No pneumothorax or pleural effusion. No confluent airspace disease. Surgical changes of the cervical region. No displaced fracture. IMPRESSION: Negative for acute cardiopulmonary disease Electronically Signed   By: Gilmer Mor D.O.   On: 02/16/2018 15:46    Procedures Procedures (including critical care time)  CRITICAL CARE Performed by: Bethel Born   Total critical care time: 71  minutes  Critical care time was exclusive of separately billable procedures and treating other patients.  Critical care was necessary to treat or prevent imminent or life-threatening deterioration.  Critical care was time spent personally by me on the following activities: development of treatment plan with patient and/or surrogate as well as nursing, discussions with consultants, evaluation of patient's response to treatment, examination of patient, obtaining history from patient or surrogate, ordering and performing treatments and interventions, ordering and review of laboratory studies, ordering and review of radiographic studies, pulse oximetry and re-evaluation of patient's condition.   Medications Ordered in ED Medications  nitroGLYCERIN (NITROSTAT) SL tablet 0.4 mg (0.4 mg Sublingual Given 02/16/18 1641)  heparin bolus via infusion 4,000 Units (has no administration in time range)  heparin ADULT infusion 100 units/mL (25000 units/239mL sodium chloride  0.45%) (has no administration in time range)     Initial Impression / Assessment and Plan / ED Course  I have reviewed the triage vital signs and the nursing notes.  Pertinent labs & imaging results that were available during my care of the patient were reviewed by me and considered in my medical decision making (see chart for details).  62 year old female presents with near syncopal episode and now chest pain since she is arrived in the ED.  Her vital signs are normal.  Her exam is unremarkable.  She reports aching chest pain which feels similar to how she felt leading up to her MI.  Her EKG is normal sinus rhythm.  Her troponin is normal.  Her blood work is unremarkable other than mild hyperglycemia.  Will discuss with cardiology.  4:39 PM Spoke with Dr. Marni Griffon who recommends giving Nitro, starting Heparin, and he will come to see the patient.   Final Clinical Impressions(s) / ED Diagnoses   Final diagnoses:  Chest pain, unspecified type  Near syncope    ED Discharge Orders    None       Bethel Born, PA-C 02/16/18 1704    Melene Plan, DO 02/16/18 1843

## 2018-02-16 NOTE — ED Notes (Signed)
Pt only received only 1 Nitro to be 0/10

## 2018-02-16 NOTE — Progress Notes (Signed)
ANTICOAGULATION CONSULT NOTE - Initial Consult  Pharmacy Consult for heparin Indication: chest pain/ACS  Patient Measurements: Height: 5\' 5"  (165.1 cm) Weight: 178 lb (80.7 kg) IBW/kg (Calculated) : 57 Heparin Dosing Weight: 74 kg  Vital Signs: BP: 113/74 (07/11 1630) Pulse Rate: 65 (07/11 1630)  Labs: Recent Labs    02/16/18 1350  HGB 14.4  HCT 43.3  PLT 177  CREATININE 1.03*    Medical History: Past Medical History:  Diagnosis Date  . Arthritis   . Diabetes mellitus without complication (HCC)    fasting blood sugars 150s  . Heart murmur   . Hypertension   . Myocardial infarct [facility])      Assessment: 62 yo female admitted with weakness, diaphoreses and jaw pain. Initial troponin is negative. Planning to start heparin for rule out ACS. CBC wnl, SCr 1.  Goal of Therapy:  Heparin level 0.3-0.7 units/ml Monitor platelets by anticoagulation protocol: Yes    Plan:  -Heparin bolus 4000 units x1 then 1200 units/hr -Daily HL, CBC -Check level in 6 hours    Baldemar Friday 02/16/2018,4:41 PM

## 2018-02-16 NOTE — ED Notes (Signed)
Patient transported to X-ray 

## 2018-02-17 ENCOUNTER — Observation Stay (HOSPITAL_COMMUNITY): Payer: BLUE CROSS/BLUE SHIELD

## 2018-02-17 DIAGNOSIS — R079 Chest pain, unspecified: Secondary | ICD-10-CM | POA: Diagnosis not present

## 2018-02-17 DIAGNOSIS — I10 Essential (primary) hypertension: Secondary | ICD-10-CM | POA: Diagnosis not present

## 2018-02-17 DIAGNOSIS — E119 Type 2 diabetes mellitus without complications: Secondary | ICD-10-CM | POA: Diagnosis not present

## 2018-02-17 DIAGNOSIS — Z7982 Long term (current) use of aspirin: Secondary | ICD-10-CM | POA: Diagnosis not present

## 2018-02-17 LAB — GLUCOSE, CAPILLARY
Glucose-Capillary: 132 mg/dL — ABNORMAL HIGH (ref 70–99)
Glucose-Capillary: 160 mg/dL — ABNORMAL HIGH (ref 70–99)

## 2018-02-17 LAB — HEPARIN LEVEL (UNFRACTIONATED)
HEPARIN UNFRACTIONATED: 0.49 [IU]/mL (ref 0.30–0.70)
Heparin Unfractionated: 0.4 IU/mL (ref 0.30–0.70)

## 2018-02-17 LAB — CBC
HEMATOCRIT: 36.8 % (ref 36.0–46.0)
Hemoglobin: 12.2 g/dL (ref 12.0–15.0)
MCH: 30.2 pg (ref 26.0–34.0)
MCHC: 33.2 g/dL (ref 30.0–36.0)
MCV: 91.1 fL (ref 78.0–100.0)
Platelets: 148 10*3/uL — ABNORMAL LOW (ref 150–400)
RBC: 4.04 MIL/uL (ref 3.87–5.11)
RDW: 12.7 % (ref 11.5–15.5)
WBC: 6.9 10*3/uL (ref 4.0–10.5)

## 2018-02-17 LAB — LIPID PANEL
CHOL/HDL RATIO: 6.5 ratio
Cholesterol: 200 mg/dL (ref 0–200)
HDL: 31 mg/dL — AB (ref >40)
LDL Cholesterol: 130 mg/dL — ABNORMAL HIGH (ref 0–99)
TRIGLYCERIDES: 194 mg/dL — AB (ref <150)
VLDL: 39 mg/dL (ref 0–40)

## 2018-02-17 LAB — BASIC METABOLIC PANEL
Anion gap: 9 (ref 5–15)
BUN: 28 mg/dL — ABNORMAL HIGH (ref 8–23)
CO2: 24 mmol/L (ref 22–32)
Calcium: 8.5 mg/dL — ABNORMAL LOW (ref 8.9–10.3)
Chloride: 108 mmol/L (ref 98–111)
Creatinine, Ser: 0.78 mg/dL (ref 0.44–1.00)
GFR calc Af Amer: >60 mL/min (ref >60)
GFR calc non Af Amer: >60 mL/min (ref >60)
GLUCOSE: 126 mg/dL — AB (ref 70–99)
POTASSIUM: 3.9 mmol/L (ref 3.5–5.1)
Sodium: 141 mmol/L (ref 135–145)

## 2018-02-17 LAB — HIV ANTIBODY (ROUTINE TESTING W REFLEX): HIV SCREEN 4TH GENERATION: NONREACTIVE

## 2018-02-17 LAB — TROPONIN I: Troponin I: <0.03 ng/mL (ref <0.03)

## 2018-02-17 MED ORDER — TECHNETIUM TC 99M TETROFOSMIN IV KIT
30.0000 | PACK | Freq: Once | INTRAVENOUS | Status: AC | PRN
  Administered 2018-02-17: 30 via INTRAVENOUS

## 2018-02-17 MED ORDER — EZETIMIBE 10 MG PO TABS: 10.0000 mg | ORAL_TABLET | Freq: Every day | ORAL | 3 refills | Status: DC

## 2018-02-17 MED ORDER — REGADENOSON 0.4 MG/5ML IV SOLN
INTRAVENOUS | Status: AC
  Filled 2018-02-17: qty 5

## 2018-02-17 MED ORDER — REGADENOSON 0.4 MG/5ML IV SOLN
0.4000 mg | Freq: Once | INTRAVENOUS | Status: AC
  Administered 2018-02-17: 0.4 mg via INTRAVENOUS
  Filled 2018-02-17: qty 5

## 2018-02-17 MED ORDER — TECHNETIUM TC 99M TETROFOSMIN IV KIT
10.0000 | PACK | Freq: Once | INTRAVENOUS | Status: AC | PRN
  Administered 2018-02-17: 10 via INTRAVENOUS

## 2018-02-17 NOTE — Discharge Instructions (Signed)

## 2018-02-17 NOTE — Discharge Summary (Signed)
*  summary dictated on 02/17/2018, dictation number is [phone]

## 2018-02-17 NOTE — Progress Notes (Signed)
ANTICOAGULATION CONSULT NOTE - Follow-Up Consult  Pharmacy Consult for heparin Indication: chest pain/ACS  Patient Measurements: Height: 5\' 5"  (165.1 cm) Weight: 178 lb (80.7 kg) IBW/kg (Calculated) : 57 Heparin Dosing Weight: 74 kg  Vital Signs: Temp: 98 F (36.7 C) (07/11 2054) Temp Source: Oral (07/11 2054) BP: 126/89 (07/11 2054) Pulse Rate: 69 (07/11 2054)  Labs: Recent Labs    02/16/18 1350 02/16/18 1843 02/17/18 0224  HGB 14.4  --  12.2  HCT 43.3  --  36.8  PLT 177  --  148*  HEPARINUNFRC  --  0.61 0.40  CREATININE 1.03*  --   --   TROPONINI  --  <0.03  --     Assessment: 62 yo female admitted with weakness, diaphoreses and jaw pain. Initial troponin is negative. Planning to start heparin for rule out ACS.   Heparin level this morning is therapeutic (HL 0.4, goal of 0.3-0.7).   Goal of Therapy:  Heparin level 0.3-0.7 units/ml Monitor platelets by anticoagulation protocol: Yes   Plan:  - Continue Heparin at 1200 units/hr (12 ml/hr) - Will continue to monitor for any signs/symptoms of bleeding and will follow up with heparin level in 6 hours to confirm therapeutic  Thank you for allowing pharmacy to be a part of this patient's care.  Georgina Pillion, PharmD, BCPS Clinical Pharmacist Pager: [phone] If after 3:30p, please call main pharmacy at: [phone] 02/17/2018 3:31 AM

## 2018-02-17 NOTE — Progress Notes (Signed)
ANTICOAGULATION CONSULT NOTE - Follow-Up Consult  Pharmacy Consult for heparin Indication: chest pain/ACS  Patient Measurements: Height: 5\' 5"  (165.1 cm) Weight: 187 lb 11.2 oz (85.1 kg) IBW/kg (Calculated) : 57 Heparin Dosing Weight: 74 kg  Vital Signs: Temp: 97.6 F (36.4 C) (07/12 0510) Temp Source: Oral (07/12 0510) BP: 148/76 (07/12 0912) Pulse Rate: 66 (07/12 0510)  Labs: Recent Labs    02/16/18 1350 02/16/18 1843 02/17/18 0224 02/17/18 0725  HGB 14.4  --  12.2  --   HCT 43.3  --  36.8  --   PLT 177  --  148*  --   HEPARINUNFRC  --  0.61 0.40 0.49  CREATININE 1.03*  --  0.78  --   TROPONINI  --  <0.03 <0.03  --     Assessment: 62 yo female admitted with weakness, diaphoreses and jaw pain. Initial troponin is negative. Planning to start heparin for rule out ACS.   Heparin level therapeutic this morning at 0.49, on 1200 units/hr. Hgb 12.2, plt 148, troponin remains <0.03. No s/sx of bleeding. No infusion issues.    Goal of Therapy:  Heparin level 0.3-0.7 units/ml Monitor platelets by anticoagulation protocol: Yes   Plan:  - Continue Heparin at 1200 units/hr (12 ml/hr) - Monitor CBC, HL, and for s/sx of bleeding   Thank you for allowing pharmacy to be a part of this patient's care.  Girard Cooter, PharmD Clinical Pharmacist  Pager: [phone] Phone: [phone] 02/17/2018 9:16 AM

## 2018-02-17 NOTE — Discharge Summary (Signed)
NAME: [name], [name] MEDICAL RECORD [id number] ACCOUNT [id number] DATE OF BIRTH:03/15/1956 FACILITY: [facility] LOCATION: [facility] PHYSICIAN:Kierstan Auer Jeoffrey Massed, MD  DISCHARGE SUMMARY  DATE OF DISCHARGE:  02/17/2018  ADMITTING DIAGNOSES:   1.  Chest pain/left arm pain, worrisome for angina, rule out progression of coronary artery disease, rule out myocardial  infarction. 2.  Status near syncope. 3.  Coronary artery disease, history of anteroseptal wall myocardial infarction in 02/2014 status post percutaneous coronary intervention  to left anterior descending in the past. 4.  Hypertension. 5.  Hyperlipidemia. 6.  Insulin-requiring diabetes mellitus. 7.  Positive family history of coronary artery disease.  DISCHARGE DIAGNOSES: 1.  Stable angina.  Myocardial infarction ruled out.  Negative nuclear stress test. 2.  Status post near syncope/possible heat exhaustion.   3.  Coronary artery disease status post anteroseptal wall myocardial infarction in 02/2014,  status post percutaneous coronary intervention to left anterior descending  in the past. 4.  Hypertension. 5.  Hyperlipidemia. 6.  Insulin-requiring diabetes mellitus. 7.  Positive family history of coronary artery disease.      DISCHARGE HOME MEDICATIONS:   1.  Zetia 10 mg daily. 2.  Amlodipine 2.5 mg daily. 3.  Aspirin 81 mg daily. 4.  Lantus insulin 42 units daily at night as before. 5.  Metoprolol tartrate 12.5 mg twice daily. 6.  Nitrostat 0.4 mg sublingual use as directed. 8.  Vitamin D 50,000 units 1 capsule every week.   9.  Ramipril 5 mg 1 capsule daily.  DIET:  Low salt, low cholesterol 1800 calories ADA diet.  CONDITION ON DISCHARGE:  Stable.  The patient has been advised to monitor blood pressure and blood sugar at home regularly.  The patient also advised to refrain from going in the heat for prolonged hours.  CONDITION AT DISCHARGE:  Stable.  BRIEF HISTORY AND HOSPITAL COURSE:  The patient is a 62 year old  female with past medical history significant for coronary artery disease, history of anteroseptal wall myocardial infarction in 02/2014 requiring PTCA stenting to proximal and mid LAD,  hypertension, hyperlipidemia, insulin requiring diabetes mellitus, positive family history of coronary artery disease, she came to the ER by EMS complaining of while working in the yard, the patient suddenly became dizzy, diaphoretic associated with  nausea and had near syncopal episode.  The patient denies any slurred speech, weakness in the arms or legs.  While in the ED, the patient developed retrosternal chest pain, grade 2/10, radiating to the inner side of the left arm.  Received 1 sublingual  nitro with relief of chest pain.  The patient denies any palpitations, lightheadedness or syncopal episode.  Denies any recent cardiac workup.  PHYSICAL EXAMINATION:   GENERAL:  She was alert, awake, oriented x3.  No acute distress. VITAL SIGNS:  Blood pressure was 113/74, pulse 65.  She was afebrile. HEENT:  Conjunctivae are pink. NECK:  Supple, no JVD, no bruit. LUNGS:  Clear to auscultation without rhonchi or rales. CARDIOVASCULAR:  S1, S2 are normal.  There was soft systolic murmur.  No S3 gallop. ABDOMEN:  Soft.  Bowel sounds present, nontender. EXTREMITIES:  There is no clubbing, cyanosis or edema. NEUROLOGIC:  Grossly intact.  LABORATORY DATA:  Sodium was 141, potassium 3.9, BUN was 21, creatinine 1.03.  Blood sugar was 159.  Troponin I, 2 sets were negative.  Hemoglobin was 14.4, hematocrit 43.3, white count of 6.0.  Her cholesterol was 200, HDL was low at 31, LDL was  elevated at 130, triglycerides 194.  Hemoglobin A1c was  also elevated at 8.0.  HIV screen was negative.    EKG showed normal sinus rhythm with poor R-wave progression in anterior leads.  Lexiscan Myoview showed no evidence of reversible ischemia with normal LV systolic function.  BRIEF HOSPITAL COURSE:  The patient was admitted to Telemetry  Unit.  MI was ruled out by serial enzymes and EKG.  The patient did not have any episodes of chest pain during the hospital stay.  Neither did patient have any episodes of dizziness, near  syncopal episode or any cardiac arrhythmias.  The patient subsequently underwent a Lexiscan Myoview today, which showed no evidence of ischemia or infarction with normal LV systolic function.  The patient is ambulating in the hallway without any  problems.  The patient will be discharged home on the above medications and will be followed up in my office in 1 week.  AN/NUANCE D:02/17/2018 T:02/17/2018 JOB:[id number]

## 2019-12-29 ENCOUNTER — Other Ambulatory Visit: Payer: Self-pay

## 2019-12-29 ENCOUNTER — Encounter (HOSPITAL_COMMUNITY): Payer: Self-pay

## 2019-12-29 ENCOUNTER — Emergency Department (HOSPITAL_COMMUNITY): Payer: BC Managed Care – PPO

## 2019-12-29 ENCOUNTER — Inpatient Hospital Stay (HOSPITAL_COMMUNITY)
Admission: EM | Admit: 2019-12-29 | Discharge: 2020-01-01 | DRG: 287 | Disposition: A | Discharge status: Home / Self Care | Payer: BC Managed Care – PPO | Attending: Cardiology | Admitting: Cardiology

## 2019-12-29 DIAGNOSIS — Z85828 Personal history of other malignant neoplasm of skin: Secondary | ICD-10-CM

## 2019-12-29 DIAGNOSIS — K219 Gastro-esophageal reflux disease without esophagitis: Secondary | ICD-10-CM | POA: Diagnosis present

## 2019-12-29 DIAGNOSIS — Z20822 Contact with and (suspected) exposure to covid-19: Secondary | ICD-10-CM | POA: Diagnosis present

## 2019-12-29 DIAGNOSIS — Z7982 Long term (current) use of aspirin: Secondary | ICD-10-CM | POA: Diagnosis not present

## 2019-12-29 DIAGNOSIS — Z79899 Other long term (current) drug therapy: Secondary | ICD-10-CM | POA: Diagnosis not present

## 2019-12-29 DIAGNOSIS — Z6830 Body mass index (BMI) 30.0-30.9, adult: Secondary | ICD-10-CM

## 2019-12-29 DIAGNOSIS — M549 Dorsalgia, unspecified: Secondary | ICD-10-CM | POA: Diagnosis present

## 2019-12-29 DIAGNOSIS — R55 Syncope and collapse: Secondary | ICD-10-CM | POA: Diagnosis present

## 2019-12-29 DIAGNOSIS — I252 Old myocardial infarction: Secondary | ICD-10-CM

## 2019-12-29 DIAGNOSIS — I200 Unstable angina: Secondary | ICD-10-CM

## 2019-12-29 DIAGNOSIS — R079 Chest pain, unspecified: Secondary | ICD-10-CM | POA: Diagnosis not present

## 2019-12-29 DIAGNOSIS — I209 Angina pectoris, unspecified: Secondary | ICD-10-CM

## 2019-12-29 DIAGNOSIS — E785 Hyperlipidemia, unspecified: Secondary | ICD-10-CM | POA: Diagnosis present

## 2019-12-29 DIAGNOSIS — I10 Essential (primary) hypertension: Secondary | ICD-10-CM | POA: Diagnosis present

## 2019-12-29 DIAGNOSIS — Z981 Arthrodesis status: Secondary | ICD-10-CM

## 2019-12-29 DIAGNOSIS — Z794 Long term (current) use of insulin: Secondary | ICD-10-CM

## 2019-12-29 DIAGNOSIS — M542 Cervicalgia: Secondary | ICD-10-CM | POA: Diagnosis present

## 2019-12-29 DIAGNOSIS — E1165 Type 2 diabetes mellitus with hyperglycemia: Secondary | ICD-10-CM | POA: Diagnosis present

## 2019-12-29 DIAGNOSIS — E669 Obesity, unspecified: Secondary | ICD-10-CM | POA: Diagnosis present

## 2019-12-29 DIAGNOSIS — I25110 Atherosclerotic heart disease of native coronary artery with unstable angina pectoris: Principal | ICD-10-CM | POA: Diagnosis present

## 2019-12-29 DIAGNOSIS — M1990 Unspecified osteoarthritis, unspecified site: Secondary | ICD-10-CM | POA: Diagnosis present

## 2019-12-29 DIAGNOSIS — R61 Generalized hyperhidrosis: Secondary | ICD-10-CM | POA: Diagnosis present

## 2019-12-29 DIAGNOSIS — G8929 Other chronic pain: Secondary | ICD-10-CM | POA: Diagnosis present

## 2019-12-29 DIAGNOSIS — Z955 Presence of coronary angioplasty implant and graft: Secondary | ICD-10-CM

## 2019-12-29 DIAGNOSIS — Z8249 Family history of ischemic heart disease and other diseases of the circulatory system: Secondary | ICD-10-CM | POA: Diagnosis not present

## 2019-12-29 DIAGNOSIS — I25119 Atherosclerotic heart disease of native coronary artery with unspecified angina pectoris: Secondary | ICD-10-CM

## 2019-12-29 HISTORY — DX: Atherosclerotic heart disease of native coronary artery with unspecified angina pectoris: I25.119

## 2019-12-29 LAB — BASIC METABOLIC PANEL
Anion gap: 10 (ref 5–15)
BUN: 20 mg/dL (ref 8–23)
CO2: 25 mmol/L (ref 22–32)
Calcium: 9.3 mg/dL (ref 8.9–10.3)
Chloride: 102 mmol/L (ref 98–111)
Creatinine, Ser: 0.76 mg/dL (ref 0.44–1.00)
GFR calc Af Amer: >60 mL/min (ref >60)
GFR calc non Af Amer: >60 mL/min (ref >60)
Glucose, Bld: 247 mg/dL — ABNORMAL HIGH (ref 70–99)
Potassium: 4 mmol/L (ref 3.5–5.1)
Sodium: 137 mmol/L (ref 135–145)

## 2019-12-29 LAB — TROPONIN I (HIGH SENSITIVITY)
Troponin I (High Sensitivity): 8 ng/L (ref <18)
Troponin I (High Sensitivity): 10 ng/L (ref <18)

## 2019-12-29 LAB — HEPATIC FUNCTION PANEL
ALT: 24 U/L (ref 0–44)
AST: 26 U/L (ref 15–41)
Albumin: 3.5 g/dL (ref 3.5–5.0)
Alkaline Phosphatase: 45 U/L (ref 38–126)
Bilirubin, Direct: 0.1 mg/dL (ref 0.0–0.2)
Indirect Bilirubin: 0.8 mg/dL (ref 0.3–0.9)
Total Bilirubin: 0.9 mg/dL (ref 0.3–1.2)
Total Protein: 6.2 g/dL — ABNORMAL LOW (ref 6.5–8.1)

## 2019-12-29 LAB — CBC WITH DIFFERENTIAL/PLATELET
Abs Immature Granulocytes: 0.01 10*3/uL (ref 0.00–0.07)
Basophils Absolute: 0 10*3/uL (ref 0.0–0.1)
Basophils Relative: 1 %
Eosinophils Absolute: 0.1 10*3/uL (ref 0.0–0.5)
Eosinophils Relative: 2 %
HCT: 40.7 % (ref 36.0–46.0)
Hemoglobin: 13.7 g/dL (ref 12.0–15.0)
Immature Granulocytes: 0 %
Lymphocytes Relative: 36 %
Lymphs Abs: 2.1 10*3/uL (ref 0.7–4.0)
MCH: 30.8 pg (ref 26.0–34.0)
MCHC: 33.7 g/dL (ref 30.0–36.0)
MCV: 91.5 fL (ref 80.0–100.0)
Monocytes Absolute: 0.4 10*3/uL (ref 0.1–1.0)
Monocytes Relative: 7 %
Neutro Abs: 3.1 10*3/uL (ref 1.7–7.7)
Neutrophils Relative %: 54 %
Platelets: 172 10*3/uL (ref 150–400)
RBC: 4.45 MIL/uL (ref 3.87–5.11)
RDW: 12.4 % (ref 11.5–15.5)
WBC: 5.7 10*3/uL (ref 4.0–10.5)
nRBC: 0 % (ref 0.0–0.2)

## 2019-12-29 LAB — LIPID PANEL
Cholesterol: 260 mg/dL — ABNORMAL HIGH (ref 0–200)
HDL: 37 mg/dL — ABNORMAL LOW (ref >40)
LDL Cholesterol: 171 mg/dL — ABNORMAL HIGH (ref 0–99)
Total CHOL/HDL Ratio: 7 RATIO
Triglycerides: 261 mg/dL — ABNORMAL HIGH (ref <150)
VLDL: 52 mg/dL — ABNORMAL HIGH (ref 0–40)

## 2019-12-29 LAB — SARS CORONAVIRUS 2 BY RT PCR (HOSPITAL ORDER, PERFORMED IN ~~LOC~~ HOSPITAL LAB): SARS Coronavirus 2: NEGATIVE

## 2019-12-29 LAB — HEMOGLOBIN A1C
Hgb A1c MFr Bld: 7.9 % — ABNORMAL HIGH (ref 4.8–5.6)
Mean Plasma Glucose: 180.03 mg/dL

## 2019-12-29 LAB — GLUCOSE, CAPILLARY: Glucose-Capillary: 153 mg/dL — ABNORMAL HIGH (ref 70–99)

## 2019-12-29 LAB — CBG MONITORING, ED: Glucose-Capillary: 165 mg/dL — ABNORMAL HIGH (ref 70–99)

## 2019-12-29 MED ORDER — HEPARIN BOLUS VIA INFUSION
4000.0000 [IU] | Freq: Once | INTRAVENOUS | Status: AC
  Administered 2019-12-29: 4000 [IU] via INTRAVENOUS
  Filled 2019-12-29: qty 4000

## 2019-12-29 MED ORDER — ACETAMINOPHEN 325 MG PO TABS: 650.0000 mg | ORAL_TABLET | ORAL | Status: DC | PRN

## 2019-12-29 MED ORDER — INSULIN ASPART 100 UNIT/ML ~~LOC~~ SOLN
0.0000 [IU] | Freq: Three times a day (TID) | SUBCUTANEOUS | Status: DC
  Administered 2019-12-30: 2 [IU] via SUBCUTANEOUS

## 2019-12-29 MED ORDER — INSULIN ASPART 100 UNIT/ML ~~LOC~~ SOLN: 0.0000 [IU] | Freq: Every day | SUBCUTANEOUS | Status: DC

## 2019-12-29 MED ORDER — HEPARIN (PORCINE) 25000 UT/250ML-% IV SOLN
1000.0000 [IU]/h | INTRAVENOUS | Status: DC
  Administered 2019-12-29: 900 [IU]/h via INTRAVENOUS
  Administered 2019-12-30 – 2020-01-01 (×2): 1000 [IU]/h via INTRAVENOUS
  Filled 2019-12-29 (×3): qty 250

## 2019-12-29 MED ORDER — INSULIN GLARGINE 100 UNIT/ML ~~LOC~~ SOLN
20.0000 [IU] | Freq: Two times a day (BID) | SUBCUTANEOUS | Status: DC
  Administered 2019-12-29 – 2020-01-01 (×6): 20 [IU] via SUBCUTANEOUS
  Filled 2019-12-29 (×8): qty 0.2

## 2019-12-29 MED ORDER — ONDANSETRON HCL 4 MG/2ML IJ SOLN: 4.0000 mg | Freq: Four times a day (QID) | INTRAMUSCULAR | Status: DC | PRN

## 2019-12-29 MED ORDER — NITROGLYCERIN 0.4 MG SL SUBL: 0.4000 mg | SUBLINGUAL_TABLET | SUBLINGUAL | Status: DC | PRN

## 2019-12-29 NOTE — ED Triage Notes (Signed)
Pt BIB [facility] from home following episode of CP, pt came in from working outside and felt central tightness, radiating to L arm. Hx stent placement 2015. No SOB, some nausea in ambulance.Pt took 1 nitro at home w/ relief, 324 ASA per EMS. Denies pain at this time, NAD, noted, pt A&Ox4.

## 2019-12-29 NOTE — H&P (Signed)
CARDIOLOGY ADMISSION NOTE  Patient ID: [name] MRN: [id number] DOB/AGE: 11/22/1955 64 y.o.  Admit date: 12/29/2019 Primary Physician: Dr Sherral Hammers Primary Cardiologist: Dr Isabella Stalling Chief Complaint: Chest pain  ASSESSMENT AND PLAN:   1. Typical angina/unstable angina 2. CAD s/p PCI prox-mid LAD in 2015 (in the setting of anteriiorSTEMI) LHC showed 99% prox stenosis s/p PCI, diag1 30-40% dx, LCX mild dx, Om1 small, non dominant right 3. HTN 4. HLD- uncontrolled (LDL 130)= intolerant to statins 5. Obesity 6. Chronic back pain, neck pain  Plan:  - admit to cardiology service  - s/p aspirin full dose and nitro with complete resolution of chest pain  -Start heparin ACS protocol  -Continue aspirin 81 mg, Norvasc 2.5 mg, metoprolol 12.5 mg twice daily, ramipril 5 mg daily.  Recheck A1c and lipid panel.  Patient reports intolerance to statins causing myalgias previously tried Zetia and stopped.  Given extensive history of coronary artery disease and positive family history, diabetes, uncontrolled hyperlipidemia she might be a candidate for PCSK9 inhibitor.  - continue home insulin and sliding scale  -Plan for diagnostic coronary angiogram/LHC on Monday  -Obtain transthoracic echo to evaluate any structural abnormalities and LVEF   HPI:  [name] is a 64 year old lady with past medical history of CAD status post STEMI in 2015 s/p proximal to mid LAD PCI, normal LVEF, diabetes, uncontrolled hypertension, obesity, family history of coronary artery disease presents to the ER with complaints of chest pain.  Patient reports ongoing exertional chest pain since 4 weeks but today while she was working in the garden had worsening chest pain, squeezing in nature radiating to the left arm and back associated with mild diaphoresis and somewhat improved on rest but persisted thus needing to call 911.  She was instructed to take aspirin as well as nitroglycerin with complete resolution of chest  pain. When presented to the ER she was chest pain-free.  ER work-up largely negative with EKG showing nonspecific ST-T wave changes, troponin negative and patient chest pain-free and hemodynamically stable. She denies any chest pain at the time of my visit or any other associated symptoms  She has been also dealing with some back issues and neck issues likely DJD for which she anticipates getting some sort of procedure in the near future.  In 2019 she presented with near syncope and chest pain for which she underwent nuclear stress test which was negative   EKG:NSR, non specific ST-T wave changes  Past Medical History:  Diagnosis Date  . Arthritis    "qwhere" (02/16/2018)  . Chest pain due to CAD (HCC) 12/29/2019  . GERD (gastroesophageal reflux disease)   . Heart murmur   . High cholesterol   . History of hiatal hernia dx'd 1979  . History of kidney stones    "passed them"  . Hypertension   . Myocardial infarct (HCC) 02/2014  . Type II diabetes mellitus (HCC)    fasting blood sugars 150s    Past Surgical History:  Procedure Laterality Date  . ANTERIOR CERVICAL DECOMP/DISCECTOMY FUSION N/A 11/20/2012   Procedure: ANTERIOR CERVICAL DECOMPRESSION/DISCECTOMY FUSION 1 LEVEL;  Surgeon: Hewitt Shorts, MD;  Location: [facility];  Service: Neurosurgery;  Laterality: N/A;  Cervical four - five  anterior cervical decompression with fusion plating and bonegraft  . BACK SURGERY    . BASAL CELL CARCINOMA EXCISION     "took off in the office; off my face" (02/16/2018)  . CATARACT EXTRACTION W/ INTRAOCULAR LENS  IMPLANT, BILATERAL  Bilateral   . LEFT HEART CATHETERIZATION WITH CORONARY ANGIOGRAM N/A 03/05/2014   Procedure: LEFT HEART CATHETERIZATION WITH CORONARY ANGIOGRAM;  Surgeon: Clent Demark, MD;  Location: [facility] CATH LAB;  Service: Cardiovascular;  Laterality: N/A;  . TONSILLECTOMY      Allergies  Allergen Reactions  . Sulfa Antibiotics Other (See Comments)    Blisters on nose   No  current facility-administered medications on file prior to encounter.   Current Outpatient Medications on File Prior to Encounter  Medication Sig Dispense Refill  . amLODipine (NORVASC) 2.5 MG tablet Take 2.5 mg by mouth daily.    Marland Kitchen aspirin EC 81 MG EC tablet Take 1 tablet (81 mg total) by mouth daily. 30 tablet 3  . insulin glargine (LANTUS) 100 UNIT/ML injection Inject 25 Units into the skin 2 (two) times daily. Based on sugar levels    . metoprolol tartrate (LOPRESSOR) 12.5 mg TABS tablet Take 0.5 tablets (12.5 mg total) by mouth 2 (two) times daily. 30 tablet 3  . nitroGLYCERIN (NITROSTAT) 0.4 MG SL tablet Place 1 tablet (0.4 mg total) under the tongue every 5 (five) minutes as needed for chest pain. 25 tablet 12  . ramipril (ALTACE) 5 MG capsule Take 1 capsule (5 mg total) by mouth daily. (Patient taking differently: Take 10 mg by mouth daily. ) 30 capsule 3  . ezetimibe (ZETIA) 10 MG tablet Take 1 tablet (10 mg total) by mouth daily. (Patient not taking: Reported on 12/29/2019) 30 tablet 3   Social History   Socioeconomic History  . Marital status: Single    Spouse name: Not on file  . Number of children: Not on file  . Years of education: Not on file  . Highest education level: Not on file  Occupational History  . Not on file  Tobacco Use  . Smoking status: Never Smoker  . Smokeless tobacco: Never Used  . Tobacco comment: 02/16/2018 "smoked 2 wks in ~ 1977 & gave it"  Substance and Sexual Activity  . Alcohol use: Never  . Drug use: Never  . Sexual activity: Not Currently  Other Topics Concern  . Not on file  Social History Narrative  . Not on file   Social Determinants of Health   Financial Resource Strain:   . Difficulty of Paying Living Expenses:   Food Insecurity:   . Worried About Charity fundraiser in the Last Year:   . Arboriculturist in the Last Year:   Transportation Needs:   . Film/video editor (Medical):   Marland Kitchen Lack of Transportation (Non-Medical):     Physical Activity:   . Days of Exercise per Week:   . Minutes of Exercise per Session:   Stress:   . Feeling of Stress :   Social Connections:   . Frequency of Communication with Friends and Family:   . Frequency of Social Gatherings with Friends and Family:   . Attends Religious Services:   . Active Member of Clubs or Organizations:   . Attends Archivist Meetings:   Marland Kitchen Marital Status:   Intimate Partner Violence:   . Fear of Current or Ex-Partner:   . Emotionally Abused:   Marland Kitchen Physically Abused:   . Sexually Abused:     History reviewed. No pertinent family history.   Review of Systems: [y] = yes, [ ]  = no       General: Weight gain [] ; Weight loss [ ] ; Anorexia [ ] ; Fatigue [ ] ; Fever [ ] ;  Chills [ ] ; Weakness [ ]     Cardiac: Chest pain/pressure  ]; Resting SOB [ ] ; Exertional SOB  ]; Orthopnea [ ] ; Pedal Edema [ ] ; Palpitations [ ] ; Syncope [ ] ; Presyncope [ ] ; Paroxysmal nocturnal dyspnea[ ]     Pulmonary: Cough [ ] ; Wheezing[ ] ; Hemoptysis[ ] ; Sputum [ ] ; Snoring [ ]     GI: Vomiting[ ] ; Dysphagia[ ] ; Melena[ ] ; Hematochezia [ ] ; Heartburn[ ] ; Abdominal pain [ ] ; Constipation [ ] ; Diarrhea [ ] ; BRBPR [ ]     GU: Hematuria[ ] ; Dysuria [ ] ; Nocturia[ ]   Vascular: Pain in legs with walking [ ] ; Pain in feet with lying flat [ ] ; Non-healing sores [ ] ; Stroke [ ] ; TIA [ ] ; Slurred speech [ ] ;    Neuro: Headaches[ ] ; Vertigo[ ] ; Seizures[ ] ; Paresthesias[ ] ;Blurred vision [ ] ; Diplopia [ ] ; Vision changes [ ]     Ortho/Skin: Arthritis [ ] ; Joint pain [ ] ; Muscle pain [ ] ; Joint swelling [ ] ; Back Pain [ ] ; Rash [ ]     Psych: Depression[ ] ; Anxiety[ ]     Heme: Bleeding problems [ ] ; Clotting disorders [ ] ; Anemia [ ]     Endocrine: Diabetes [ ] ; Thyroid dysfunction[ ]   Physical Exam: Blood pressure 105/77, pulse 69, temperature 98.5 F (36.9 C), temperature source Oral, resp. rate 15, height 5\' 5"  (1.651 m), weight 83.9 kg, SpO2 93 %.   GENERAL: Patient is  afebrile, Vital signs reviewed, Well appearing, Patient appears comfortable, Alert and lucid. Obese lady EYES: Normal inspection. HEENT:  normocephalic, atraumatic , normal ENT inspection. ORAL:  Moist NECK:  supple , normal inspection. CARD:  regular rate and rhythm, heart sounds normal. RESP:  no respiratory distress, breath sounds normal. ABD: soft, non tender to palpation , BS present, soft, no organomegaly or masses . BACK: non-tender. No CVA tenderness. MUSC:  normal ROM, non-tender , no pedal edema . SKIN: color normal, no rash, warm, dry . NEURO: awake & alert, lucid, no motor/sensory deficit. Gait stable. PSYCH: mood/affect normal.   Labs: Lab Results  Component Value Date   BUN 20 12/29/2019   Lab Results  Component Value Date   CREATININE 0.76 12/29/2019   Lab Results  Component Value Date   NA 137 12/29/2019   K 4.0 12/29/2019   CL 102 12/29/2019   CO2 25 12/29/2019   Lab Results  Component Value Date   TROPONINI <0.03 02/17/2018   Lab Results  Component Value Date   WBC 5.7 12/29/2019   HGB 13.7 12/29/2019   HCT 40.7 12/29/2019   MCV 91.5 12/29/2019   PLT 172 12/29/2019   Lab Results  Component Value Date   CHOL 200 02/17/2018   HDL 31 (L) 02/17/2018   LDLCALC 130 (H) 02/17/2018   TRIG 194 (H) 02/17/2018   CHOLHDL 6.5 02/17/2018   Lab Results  Component Value Date   ALT 24 12/29/2019   AST 26 12/29/2019   ALKPHOS 45 12/29/2019   BILITOT 0.9 12/29/2019      Radiology:  CXR:  Negative    Signed: 12/29/2019, 9:02 PM

## 2019-12-29 NOTE — Progress Notes (Signed)
ANTICOAGULATION CONSULT NOTE  Pharmacy Consult for Heparin Indication: chest pain/ACS  Allergies  Allergen Reactions  . Sulfa Antibiotics Other (See Comments)    Blisters on nose    Patient Measurements: Height: 5\' 5"  (165.1 cm) Weight: 83.9 kg (185 lb) IBW/kg (Calculated) : 57 Heparin Dosing Weight: 75 kg  Vital Signs: Temp: 98.5 F (36.9 C) (05/22 1818) Temp Source: Oral (05/22 1818) BP: 105/77 (05/22 2030) Pulse Rate: 69 (05/22 2030)  Labs: Recent Labs    12/29/19 1827  HGB 13.7  HCT 40.7  PLT 172  CREATININE 0.76  TROPONINIHS 8    Estimated Creatinine Clearance: 77 mL/min (by C-G formula based on SCr of 0.76 mg/dL).   Medical History: Past Medical History:  Diagnosis Date  . Arthritis    "qwhere" (02/16/2018)  . GERD (gastroesophageal reflux disease)   . Heart murmur   . High cholesterol   . History of hiatal hernia dx'd 1979  . History of kidney stones    "passed them"  . Hypertension   . Myocardial infarct (HCC) 02/2014  . Type II diabetes mellitus (HCC)    fasting blood sugars 150s    Medications:  Scheduled:  . heparin  4,000 Units Intravenous Once    Assessment: Patient is a 51 yof that  Goal of Therapy:  Heparin level 0.3-0.7 units/ml Monitor platelets by anticoagulation protocol: Yes   Plan:  - Heparin bolus 4000 units IV x 1 dose - Heparin drip @ 900 units/hr - Heparin level in ~ 6 hours  - Monitor patient for s/s of bleeding and CBC while on heparin   64 PharmD. BCPS  12/29/2019,8:50 PM

## 2019-12-29 NOTE — ED Provider Notes (Signed)
[facility] EMERGENCY DEPARTMENT Provider Note   CSN: [id number] Arrival date & time: 12/29/19  1803     History Chief Complaint  Patient presents with  . Chest Pain    [name] is a 64 y.o. female.  The history is provided by the patient.  Chest Pain Pain location:  L chest Pain quality: pressure   Pain radiates to:  L arm Pain severity:  Moderate Onset quality:  Sudden Progression:  Resolved Context comment:  Chest pain while exerting herself today. Relieved by:  Nitroglycerin and rest Worsened by:  Nothing Associated symptoms: no abdominal pain, no back pain, no cough, no fever, no palpitations, no shortness of breath and no vomiting   Risk factors: coronary artery disease, diabetes mellitus, high cholesterol and hypertension        Past Medical History:  Diagnosis Date  . Arthritis    "qwhere" (02/16/2018)  . GERD (gastroesophageal reflux disease)   . Heart murmur   . High cholesterol   . History of hiatal hernia dx'd 1979  . History of kidney stones    "passed them"  . Hypertension   . Myocardial infarct (HCC) 02/2014  . Type II diabetes mellitus (HCC)    fasting blood sugars 150s    Patient Active Problem List   Diagnosis Date Noted  . Unstable angina (HCC) 02/16/2018  . 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 ANTERIOR CERVICAL DECOMP/DISCECTOMY FUSION N/A 11/20/2012   Procedure: ANTERIOR CERVICAL DECOMPRESSION/DISCECTOMY FUSION 1 LEVEL;  Surgeon: Hewitt Shorts, MD;  Location: [facility]URO ORS;  Service: Neurosurgery;  Laterality: N/A;  Cervical four - five  anterior cervical decompression with fusion plating and bonegraft  . BACK SURGERY    . BASAL CELL CARCINOMA EXCISION     "took off in the office; off my face" (02/16/2018)  . CATARACT EXTRACTION W/ INTRAOCULAR LENS  IMPLANT, BILATERAL Bilateral   . LEFT HEART CATHETERIZATION WITH CORONARY ANGIOGRAM N/A  03/05/2014   Procedure: LEFT HEART CATHETERIZATION WITH CORONARY ANGIOGRAM;  Surgeon: Robynn Pane, MD;  Location: [facility] CATH LAB;  Service: Cardiovascular;  Laterality: N/A;  . TONSILLECTOMY       OB History   No obstetric history on file.     History reviewed. No pertinent family history.  Social History   Tobacco Use  . Smoking status: Never Smoker  . Smokeless tobacco: Never Used  . Tobacco comment: 02/16/2018 "smoked 2 wks in ~ 1977 & gave it"  Substance Use Topics  . Alcohol use: Never  . Drug use: Never    Home Medications Prior to Admission medications   Medication Sig Start Date End Date Taking? Authorizing Provider  amLODipine (NORVASC) 2.5 MG tablet Take 2.5 mg by mouth daily.   Yes [provider]  aspirin EC 81 MG EC tablet Take 1 tablet (81 mg total) by mouth daily. 03/07/14  Yes Rinaldo Cloud, MD  insulin glargine (LANTUS) 100 UNIT/ML injection Inject 25 Units into the skin 2 (two) times daily. Based on sugar levels   Yes [provider]  metoprolol tartrate (LOPRESSOR) 12.5 mg TABS tablet Take 0.5 tablets (12.5 mg total) by mouth 2 (two) times daily. 03/07/14  Yes Rinaldo Cloud, MD  nitroGLYCERIN (NITROSTAT) 0.4 MG SL tablet Place 1 tablet (0.4 mg total) under the tongue every 5 (five) minutes as needed for chest pain. 03/07/14  Yes Rinaldo Cloud, MD  ramipril (ALTACE) 5 MG capsule  Take 1 capsule (5 mg total) by mouth daily. Patient taking differently: Take 10 mg by mouth daily.  03/07/14  Yes Rinaldo Cloud, MD  ezetimibe (ZETIA) 10 MG tablet Take 1 tablet (10 mg total) by mouth daily. Patient not taking: Reported on 12/29/2019 02/18/18   Rinaldo Cloud, MD    Allergies    Sulfa antibiotics  Review of Systems   Review of Systems  Constitutional: Negative for chills and fever.  HENT: Negative for ear pain and sore throat.   Eyes: Negative for pain and visual disturbance.  Respiratory: Negative for cough and shortness of breath.     Cardiovascular: Positive for chest pain. Negative for palpitations.  Gastrointestinal: Negative for abdominal pain and vomiting.  Genitourinary: Negative for dysuria and hematuria.  Musculoskeletal: Negative for arthralgias and back pain.  Skin: Negative for color change and rash.  Neurological: Negative for seizures and syncope.  All other systems reviewed and are negative.   Physical Exam Updated Vital Signs  ED Triage Vitals  Enc Vitals Group     BP 12/29/19 1818 111/75     Pulse Rate 12/29/19 1818 68     Resp 12/29/19 1818 18     Temp 12/29/19 1818 98.5 F (36.9 C)     Temp Source 12/29/19 1818 Oral     SpO2 12/29/19 1818 94 %     Weight 12/29/19 1814 185 lb (83.9 kg)     Height 12/29/19 1814 5\' 5"  (1.651 m)     Head Circumference --      Peak Flow --      Pain Score 12/29/19 1814 0     Pain Loc --      Pain Edu? --      Excl. in GC? --     Physical Exam Vitals and nursing note reviewed.  Constitutional:      General: She is not in acute distress.    Appearance: She is well-developed. She is not ill-appearing.  HENT:     Head: Normocephalic and atraumatic.  Eyes:     Extraocular Movements: Extraocular movements intact.     Conjunctiva/sclera: Conjunctivae normal.     Pupils: Pupils are equal, round, and reactive to light.  Cardiovascular:     Rate and Rhythm: Normal rate and regular rhythm.     Pulses:          Radial pulses are 2+ on the right side and 2+ on the left side.     Heart sounds: Normal heart sounds. No murmur.  Pulmonary:     Effort: Pulmonary effort is normal. No respiratory distress.     Breath sounds: Normal breath sounds. No decreased breath sounds, wheezing, rhonchi or rales.  Abdominal:     Palpations: Abdomen is soft.     Tenderness: There is no abdominal tenderness.  Musculoskeletal:        General: Normal range of motion.     Cervical back: Normal range of motion and neck supple.  Skin:    General: Skin is warm and dry.      Capillary Refill: Capillary refill takes less than 2 seconds.  Neurological:     General: No focal deficit present.     Mental Status: She is alert.  Psychiatric:        Mood and Affect: Mood normal.     ED Results / Procedures / Treatments   Labs (all labs ordered are listed, but only abnormal results are displayed) Labs Reviewed  BASIC METABOLIC PANEL -  Abnormal; Notable for the following components:      Result Value   Glucose, Bld 247 (*)    All other components within normal limits  HEPATIC FUNCTION PANEL - Abnormal; Notable for the following components:   Total Protein 6.2 (*)    All other components within normal limits  SARS CORONAVIRUS 2 BY RT PCR (HOSPITAL ORDER, [facility])  CBC WITH DIFFERENTIAL/PLATELET  LIPID PANEL  HEMOGLOBIN A1C  TROPONIN I (HIGH SENSITIVITY)  TROPONIN I (HIGH SENSITIVITY)    EKG EKG Interpretation  Date/Time:  Saturday Dec 29 2019 18:16:29 EDT Ventricular Rate:  68 PR Interval:    QRS Duration: 93 QT Interval:  437 QTC Calculation: 465 R Axis:   49 Text Interpretation: Sinus rhythm Probable left atrial enlargement Low voltage, precordial leads Confirmed by Lennice Sites [phone]) on 12/29/2019 6:19:41 PM   Radiology DG Chest Portable 1 View  Result Date: 12/29/2019 CLINICAL DATA:  Chest pain. EXAM: PORTABLE CHEST 1 VIEW COMPARISON:  February 16, 2018 FINDINGS: The heart size and mediastinal contours are within normal limits. Both lungs are clear. The visualized skeletal structures are unremarkable. IMPRESSION: No active disease. Electronically Signed   By: Dorise Bullion III M.D   On: 12/29/2019 18:37    Procedures .Critical Care Performed by: Lennice Sites, DO Authorized by: Lennice Sites, DO   Critical care provider statement:    Critical care time (minutes):  35   Critical care was necessary to treat or prevent imminent or life-threatening deterioration of the following conditions:  Cardiac failure    Critical care was time spent personally by me on the following activities:  Blood draw for specimens, development of treatment plan with patient or surrogate, discussions with primary provider, evaluation of patient's response to treatment, examination of patient, obtaining history from patient or surrogate, ordering and performing treatments and interventions, ordering and review of laboratory studies, ordering and review of radiographic studies, pulse oximetry, re-evaluation of patient's condition and review of old charts   I assumed direction of critical care for this patient from another provider in my specialty: no     (including critical care time)  Medications Ordered in ED Medications - No data to display  ED Course  I have reviewed the triage vital signs and the nursing notes.  Pertinent labs & imaging results that were available during my care of the patient were reviewed by me and considered in my medical decision making (see chart for details).    MDM Rules/Calculators/A&P                      MAHALIA DYKES is a 64 year old female with history of CAD, hypertension, high cholesterol, diabetes who presents to the ED with chest pain.  Patient with exertional chest pain today that resolved with nitro.  Has had several of these episodes over the past month.  No current chest pain.  She already took aspirin.  EKG shows sinus rhythm.  No ischemic changes.  Cardiac stent placed in 2015.  Had a cardiac stress test several years ago that was unremarkable.  Concern for ACS given exertional chest pain and risk factors.  Chest x-ray shows no signs of pneumonia, no pneumothorax, no pleural effusion.  Will get lab work including troponin.  Will discuss with cardiology about possible admission.  No PE risk factors and doubt PE.  Chest x-ray without any signs of infection.  No significant anemia, ocular abnormality, kidney injury.  Troponin is  8.  Chest pain-free currently.  Talk to cardiology as  concern for unstable angina.  They came down to the ED to evaluate the patient and recommend starting IV heparin for unstable angina.  Will likely have cardiac catheterization on Monday.  Hemodynamically stable throughout my care otherwise.  Admitted to cardiology.  This chart was dictated using voice recognition software.  Despite best efforts to proofread,  errors can occur which can change the documentation meaning.    Final Clinical Impression(s) / ED Diagnoses Final diagnoses:  Unstable angina [facility])    Rx / DC Orders ED Discharge Orders    None       Virgina Norfolk, DO 12/29/19 2042

## 2019-12-30 ENCOUNTER — Inpatient Hospital Stay (HOSPITAL_COMMUNITY): Payer: BC Managed Care – PPO

## 2019-12-30 DIAGNOSIS — R079 Chest pain, unspecified: Secondary | ICD-10-CM

## 2019-12-30 LAB — ECHOCARDIOGRAM COMPLETE
Height: 65 in
Weight: 2987.67 oz

## 2019-12-30 LAB — GLUCOSE, CAPILLARY
Glucose-Capillary: 103 mg/dL — ABNORMAL HIGH (ref 70–99)
Glucose-Capillary: 126 mg/dL — ABNORMAL HIGH (ref 70–99)
Glucose-Capillary: 99 mg/dL (ref 70–99)
Glucose-Capillary: 151 mg/dL — ABNORMAL HIGH (ref 70–99)

## 2019-12-30 LAB — CBC
HCT: 39.9 % (ref 36.0–46.0)
Hemoglobin: 13.6 g/dL (ref 12.0–15.0)
MCH: 31 pg (ref 26.0–34.0)
MCHC: 34.1 g/dL (ref 30.0–36.0)
MCV: 90.9 fL (ref 80.0–100.0)
Platelets: 179 10*3/uL (ref 150–400)
RBC: 4.39 MIL/uL (ref 3.87–5.11)
RDW: 12.5 % (ref 11.5–15.5)
WBC: 6.6 10*3/uL (ref 4.0–10.5)
nRBC: 0 % (ref 0.0–0.2)

## 2019-12-30 LAB — TROPONIN I (HIGH SENSITIVITY): Troponin I (High Sensitivity): 9 ng/L (ref <18)

## 2019-12-30 LAB — HEPARIN LEVEL (UNFRACTIONATED)
Heparin Unfractionated: 0.43 IU/mL (ref 0.30–0.70)
Heparin Unfractionated: 0.34 IU/mL (ref 0.30–0.70)

## 2019-12-30 LAB — HIV ANTIBODY (ROUTINE TESTING W REFLEX): HIV Screen 4th Generation wRfx: NONREACTIVE

## 2019-12-30 MED ORDER — REGADENOSON 0.4 MG/5ML IV SOLN
0.4000 mg | Freq: Once | INTRAVENOUS | Status: AC
  Filled 2019-12-30: qty 5

## 2019-12-30 MED ORDER — TECHNETIUM TC 99M TETROFOSMIN IV KIT
30.0000 | PACK | Freq: Once | INTRAVENOUS | Status: AC | PRN
  Administered 2019-12-30: 30 via INTRAVENOUS

## 2019-12-30 NOTE — Progress Notes (Signed)
ANTICOAGULATION CONSULT NOTE Pharmacy Consult for Heparin Indication: chest pain/ACS  Allergies  Allergen Reactions  . Sulfa Antibiotics Other (See Comments)    Blisters on nose    Patient Measurements: Height: 5\' 5"  (165.1 cm) Weight: 84.7 kg (186 lb 11.7 oz) IBW/kg (Calculated) : 57 Heparin Dosing Weight: 75 kg  Vital Signs: Temp: 98.1 F (36.7 C) (05/22 2244) Temp Source: Oral (05/22 2244) BP: 116/84 (05/22 2244) Pulse Rate: 66 (05/22 2244)  Labs: Recent Labs    12/29/19 1827 12/29/19 2108 12/30/19 0240  HGB 13.7  --  13.6  HCT 40.7  --  39.9  PLT 172  --  179  HEPARINUNFRC  --   --  0.43  CREATININE 0.76  --   --   TROPONINIHS 8 10  --     Estimated Creatinine Clearance: 77.4 mL/min (by C-G formula based on SCr of 0.76 mg/dL).  Assessment: 64 y.o. female with chest pain for heparin  Goal of Therapy:  Heparin level 0.3-0.7 units/ml Monitor platelets by anticoagulation protocol: Yes   Plan:  Continue Heparin at current rate  Recheck heparin level in 6 hours.   Jozsef Wescoat, 64 PharmD. BCPS  12/30/2019,3:19 AM

## 2019-12-30 NOTE — Progress Notes (Signed)
  Echocardiogram 2D Echocardiogram has been performed.  [name] 12/30/2019, 2:52 PM

## 2019-12-30 NOTE — Progress Notes (Signed)
ANTICOAGULATION CONSULT NOTE Pharmacy Consult for Heparin Indication: chest pain/ACS  Allergies  Allergen Reactions  . Sulfa Antibiotics Other (See Comments)    Blisters on nose    Patient Measurements: Height: 5\' 5"  (165.1 cm) Weight: 84.7 kg (186 lb 11.7 oz) IBW/kg (Calculated) : 57 Heparin Dosing Weight: 75 kg  Vital Signs: Temp: 97.6 F (36.4 C) (05/23 0821) Temp Source: Oral (05/23 0821) BP: 120/79 (05/23 0821) Pulse Rate: 73 (05/23 0821)  Labs: Recent Labs    12/29/19 1827 12/29/19 2108 12/30/19 0240 12/30/19 0935  HGB 13.7  --  13.6  --   HCT 40.7  --  39.9  --   PLT 172  --  179  --   HEPARINUNFRC  --   --  0.43 0.34  CREATININE 0.76  --   --   --   TROPONINIHS 8 10 9   --     Estimated Creatinine Clearance: 77.4 mL/min (by C-G formula based on SCr of 0.76 mg/dL).  Assessment: 64 year old female presented to ED 5/22 with chest pain. Pharmacy consulted to dose heparin. Patient not on anticoagulation prior to admission  Confirmatory heparin level on low end of therapeutic at 0.34. CBC WNL, no issues with infusion line or bleeding reported per RN. Will increase heparin rate to stay in therapeutic range.  Goal of Therapy:  Heparin level 0.3-0.7 units/ml Monitor platelets by anticoagulation protocol: Yes   Plan:  - Increase heparin drip to 1000 units/hour - Daily heparin level and CBC - Monitor for s/sx's of bleed    Delynn Pursley L. 64, PharmD [facility] PGY1 Pharmacy Resident [phone] 12/30/19    10:52 AM  Please check AMION for all [facility] Pharmacy phone numbers After 10:00 PM, call the Main Pharmacy [phone]

## 2019-12-30 NOTE — Progress Notes (Signed)
Subjective:  Patient denies any chest pain or shortness of breath states overall feels well.  States has not used any prior Nitrostat except 1 tablet yesterday with relief of chest pain.  EKG and high-sensitivity troponin I has been negative.  Patient has refused to take any medications for hyperlipidemia including PCSK9 inhibitors   Objective:  Vital Signs in the last 24 hours: Temp:  [97.6 F (36.4 C)-98.5 F (36.9 C)] 97.6 F (36.4 C) (05/23 0821) Pulse Rate:  [62-73] 73 (05/23 0821) Resp:  [12-19] 16 (05/23 0821) BP: (105-136)/(73-89) 120/79 (05/23 0821) SpO2:  [93 %-97 %] 96 % (05/23 0821) Weight:  [83.9 kg-84.7 kg] 84.7 kg (05/22 2244)  Intake/Output from previous day: 05/22 0701 - 05/23 0700 In: 124.5 [I.V.:124.5] Out: -  Intake/Output from this shift: No intake/output data recorded.  Physical Exam: Neck: no adenopathy, no carotid bruit, no JVD and supple, symmetrical, trachea midline Lungs: clear to auscultation bilaterally Heart: regular rate and rhythm, S1, S2 normal and Soft systolic murmur noted no S3 gallop Abdomen: soft, non-tender; bowel sounds normal; no masses,  no organomegaly Extremities: extremities normal, atraumatic, no cyanosis or edema  Lab Results: Recent Labs    12/29/19 1827 12/30/19 0240  WBC 5.7 6.6  HGB 13.7 13.6  PLT 172 179   Recent Labs    12/29/19 1827  NA 137  K 4.0  CL 102  CO2 25  GLUCOSE 247*  BUN 20  CREATININE 0.76   No results for input(s): TROPONINI in the last 72 hours.  Invalid input(s): CK, MB Hepatic Function Panel Recent Labs    12/29/19 1827  PROT 6.2*  ALBUMIN 3.5  AST 26  ALT 24  ALKPHOS 45  BILITOT 0.9  BILIDIR 0.1  IBILI 0.8   Recent Labs    12/29/19 2108  CHOL 260*   No results for input(s): PROTIME in the last 72 hours.  Imaging: Imaging results have been reviewed and DG Chest Portable 1 View  Result Date: 12/29/2019 CLINICAL DATA:  Chest pain. EXAM: PORTABLE CHEST 1 VIEW COMPARISON:  February 16, 2018 FINDINGS: The heart size and mediastinal contours are within normal limits. Both lungs are clear. The visualized skeletal structures are unremarkable. IMPRESSION: No active disease. Electronically Signed   By: Gerome Sam III M.D   On: 12/29/2019 18:37    Cardiac Studies:  Assessment/Plan:  New onset angina MI ruled out Coronary artery disease history of anteroseptal wall MI in the past status post PCI to LAD in 2015 Hypertension Hyperlipidemia Type 2 diabetes mellitus Positive family history of coronary artery disease Obesity Plan Discussed with patient various options of treatment i.e. noninvasive stress testing versus left cardiac cath possible PTCA stenting its risk and benefit agrees for nuclear stress test first. Discussed again at length regarding starting on PCSK 9inhibitor as she could not tolerate statins and Zetia in the past agrees for PCSK 9 inhibitor.  LOS: 1 day    Rinaldo Cloud 12/30/2019, 10:39 AM

## 2019-12-30 NOTE — Progress Notes (Signed)
   Chart reviewed. Patient admitted to cardiology overnight for unstable angina. Appears this is a patient of Dr. Annitta Jersey. He was notified of her admission and is agreeable to assuming care. Will update attending to reflect this change and remove from the [facility] rounding card.   Beatriz Stallion, PA-C 12/30/19; 8:32 AM

## 2019-12-31 LAB — GLUCOSE, CAPILLARY
Glucose-Capillary: 133 mg/dL — ABNORMAL HIGH (ref 70–99)
Glucose-Capillary: 129 mg/dL — ABNORMAL HIGH (ref 70–99)
Glucose-Capillary: 105 mg/dL — ABNORMAL HIGH (ref 70–99)
Glucose-Capillary: 157 mg/dL — ABNORMAL HIGH (ref 70–99)

## 2019-12-31 LAB — HEPARIN LEVEL (UNFRACTIONATED): Heparin Unfractionated: 0.51 IU/mL (ref 0.30–0.70)

## 2019-12-31 LAB — CBC
HCT: 41.8 % (ref 36.0–46.0)
Hemoglobin: 13.9 g/dL (ref 12.0–15.0)
MCH: 31 pg (ref 26.0–34.0)
MCHC: 33.3 g/dL (ref 30.0–36.0)
MCV: 93.1 fL (ref 80.0–100.0)
Platelets: 165 10*3/uL (ref 150–400)
RBC: 4.49 MIL/uL (ref 3.87–5.11)
RDW: 12.5 % (ref 11.5–15.5)
WBC: 5.4 10*3/uL (ref 4.0–10.5)
nRBC: 0 % (ref 0.0–0.2)

## 2019-12-31 MED ORDER — REGADENOSON 0.4 MG/5ML IV SOLN
INTRAVENOUS | Status: AC
  Administered 2019-12-31: 0.4 mg via INTRAVENOUS
  Filled 2019-12-31: qty 5

## 2019-12-31 MED ORDER — ASPIRIN 81 MG PO CHEW
81.0000 mg | CHEWABLE_TABLET | ORAL | Status: AC
  Administered 2020-01-01: 81 mg via ORAL
  Filled 2019-12-31: qty 1

## 2019-12-31 MED ORDER — CLOPIDOGREL BISULFATE 75 MG PO TABS
150.0000 mg | ORAL_TABLET | Freq: Once | ORAL | Status: AC
  Administered 2019-12-31: 150 mg via ORAL
  Filled 2019-12-31: qty 2

## 2019-12-31 MED ORDER — SODIUM CHLORIDE 0.9 % IV SOLN: 250.0000 mL | INTRAVENOUS | Status: DC | PRN

## 2019-12-31 MED ORDER — SODIUM CHLORIDE 0.9% FLUSH: 3.0000 mL | Freq: Two times a day (BID) | INTRAVENOUS | Status: DC

## 2019-12-31 MED ORDER — SODIUM CHLORIDE 0.9% FLUSH: 3.0000 mL | INTRAVENOUS | Status: DC | PRN

## 2019-12-31 MED ORDER — SODIUM CHLORIDE 0.9 % WEIGHT BASED INFUSION
1.0000 mL/kg/h | INTRAVENOUS | Status: DC
  Administered 2019-12-31: 1 mL/kg/h via INTRAVENOUS

## 2019-12-31 MED ORDER — TECHNETIUM TC 99M TETROFOSMIN IV KIT
30.0000 | PACK | Freq: Once | INTRAVENOUS | Status: AC | PRN
  Administered 2019-12-31: 30 via INTRAVENOUS

## 2019-12-31 NOTE — Progress Notes (Signed)
ANTICOAGULATION CONSULT NOTE Pharmacy Consult for Heparin Indication: chest pain/ACS  Allergies  Allergen Reactions  . Sulfa Antibiotics Other (See Comments)    Blisters on nose    Patient Measurements: Height: 5\' 5"  (165.1 cm) Weight: 84.1 kg (185 lb 6.4 oz) IBW/kg (Calculated) : 57 Heparin Dosing Weight: 75 kg  Vital Signs: Temp: 97.8 F (36.6 C) (05/24 0506) Temp Source: Oral (05/24 0506) BP: 140/85 (05/24 1028) Pulse Rate: 69 (05/24 0506)  Labs: Recent Labs    12/29/19 1827 12/29/19 1827 12/29/19 2108 12/30/19 0240 12/30/19 0935 12/31/19 0408 12/31/19 0835  HGB 13.7   < >  --  13.6  --  13.9  --   HCT 40.7  --   --  39.9  --  41.8  --   PLT 172  --   --  179  --  165  --   HEPARINUNFRC  --   --   --  0.43 0.34  --  0.51  CREATININE 0.76  --   --   --   --   --   --   TROPONINIHS 8  --  10 9  --   --   --    < > = values in this interval not displayed.    Estimated Creatinine Clearance: 77 mL/min (by C-G formula based on SCr of 0.76 mg/dL).  Assessment: 64 year old female presented to ED 5/22 with chest pain. Pharmacy consulted to dose heparin. Patient not on anticoagulation prior to admission. Plans for possible cath   Goal of Therapy:  Heparin level 0.3-0.7 units/ml Monitor platelets by anticoagulation protocol: Yes   Plan:  - Continue heparin at 1000 units/hour - Daily heparin level and CBC  6/22, PharmD Clinical Pharmacist **Pharmacist phone directory can now be found on [email or web address] ([email or web address]).  Listed under [facility].

## 2019-12-31 NOTE — Progress Notes (Signed)
Subjective:  Patient presently denies any chest pain or shortness of breath states he decreased his tired and fatigued and gets chest pain while working in the yard nuclear stress test showed possible mid septal and lateral wall ischemia with EF of 68%  Objective:  Vital Signs in the last 24 hours: Temp:  [97.7 F (36.5 C)-97.8 F (36.6 C)] 97.8 F (36.6 C) (05/24 0506) Pulse Rate:  [62-69] 69 (05/24 0506) Resp:  [16] 16 (05/24 0506) BP: (135-152)/(84-90) 140/85 (05/24 1028) SpO2:  [93 %-96 %] 93 % (05/24 0506) Weight:  [84.1 kg] 84.1 kg (05/24 0415)  Intake/Output from previous day: 05/23 0701 - 05/24 0700 In: -  Out: 800 [Urine:800] Intake/Output from this shift: Total I/O In: 180 [P.O.:180] Out: 400 [Urine:400]  Physical Exam: Neck: no adenopathy, no carotid bruit, no JVD and supple, symmetrical, trachea midline Lungs: clear to auscultation bilaterally Heart: regular rate and rhythm, S1, S2 normal and Soft systolic murmur noted Abdomen: soft, non-tender; bowel sounds normal; no masses,  no organomegaly Extremities: extremities normal, atraumatic, no cyanosis or edema  Lab Results: Recent Labs    12/30/19 0240 12/31/19 0408  WBC 6.6 5.4  HGB 13.6 13.9  PLT 179 165   Recent Labs    12/29/19 1827  NA 137  K 4.0  CL 102  CO2 25  GLUCOSE 247*  BUN 20  CREATININE 0.76   No results for input(s): TROPONINI in the last 72 hours.  Invalid input(s): CK, MB Hepatic Function Panel Recent Labs    12/29/19 1827  PROT 6.2*  ALBUMIN 3.5  AST 26  ALT 24  ALKPHOS 45  BILITOT 0.9  BILIDIR 0.1  IBILI 0.8   Recent Labs    12/29/19 2108  CHOL 260*   No results for input(s): PROTIME in the last 72 hours.  Imaging: Imaging results have been reviewed and NM Myocar Multi W/Spect W/Wall Motion / EF  Result Date: 12/31/2019 CLINICAL DATA:  Chest pain. Acute aortic syndrome suspected. History of diabetes and STEMI in 2015. EXAM: MYOCARDIAL IMAGING WITH SPECT (REST  AND PHARMACOLOGIC-STRESS) GATED LEFT VENTRICULAR WALL MOTION STUDY LEFT VENTRICULAR EJECTION FRACTION TECHNIQUE: Standard myocardial SPECT imaging was performed after resting intravenous injection of 10 mCi Tc-99m tetrofosmin. Subsequently, intravenous infusion of Lexiscan was performed under the supervision of the Cardiology staff. At peak effect of the drug, 30 mCi Tc-99m tetrofosmin was injected intravenously and standard myocardial SPECT imaging was performed. Quantitative gated imaging was also performed to evaluate left ventricular wall motion, and estimate left ventricular ejection fraction. COMPARISON:  Prior studies 02/17/2018 and 07/09/2016. FINDINGS: Perfusion: Compared with the prior study, there is possible new mild reversibility in the mid septum and mid lateral wall. The summed difference score is 12. No other fixed or reversible perfusion defects. Wall Motion: Normal left ventricular wall motion. No left ventricular dilation. Left Ventricular Ejection Fraction: 68% (previously 62%). End diastolic volume 62 ml End systolic volume 20 ml IMPRESSION: 1. Possible new areas reversibility in the mid septum and lateral wall, suspicious for myocardial ischemia. 2. Normal left ventricular wall motion. 3. Left ventricular ejection fraction 68% 4. Non invasive risk stratification*: Low *2012 Appropriate Use Criteria for Coronary Revascularization Focused Update: J Am Coll Cardiol. 2012;59(9):857-881. http://content.onlinejacc.org/article.aspx?articleid=1201161 Electronically Signed   By: William  Veazey M.D.   On: 12/31/2019 14:10   DG Chest Portable 1 View  Result Date: 12/29/2019 CLINICAL DATA:  Chest pain. EXAM: PORTABLE CHEST 1 VIEW COMPARISON:  February 16, 2018 FINDINGS: The heart size and   mediastinal contours are within normal limits. Both lungs are clear. The visualized skeletal structures are unremarkable. IMPRESSION: No active disease. Electronically Signed   By: Dorise Bullion III M.D   On:  12/29/2019 18:37   ECHOCARDIOGRAM COMPLETE  Result Date: 12/30/2019    ECHOCARDIOGRAM REPORT   Patient Name:   [name] Date of Exam: 12/30/2019 Medical Rec #:  [id number]         Height:       65.0 in Accession #:    [id number]        Weight:       186.7 lb Date of Birth:  Sep 12, 1955          BSA:          1.921 m Patient Age:    64 years          BP:           120/79 mmHg Patient Gender: F                 HR:           66 bpm. Exam Location:  Inpatient Procedure: 2D Echo, Cardiac Doppler and Color Doppler Indications:    Chest Pain 786.50 / R07.9  History:        Patient has no prior history of Echocardiogram examinations. CAD                 and Previous Myocardial Infarction, Signs/Symptoms:Chest Pain;                 Risk Factors:Non-Smoker.  Sonographer:    Vickie Epley RDCS Referring Phys: [id number] ROBIN Cochran  1. Left ventricular ejection fraction, by estimation, is 60 to 65%. The left ventricle has normal function. The left ventricle has no regional wall motion abnormalities. Left ventricular diastolic parameters are consistent with Grade I diastolic dysfunction (impaired relaxation).  2. Right ventricular systolic function is normal. The right ventricular size is normal.  3. The mitral valve is grossly normal. Trivial mitral valve regurgitation.  4. The aortic valve is tricuspid. Aortic valve regurgitation is not visualized. No aortic stenosis is present.  5. The inferior vena cava is normal in size with greater than 50% respiratory variability, suggesting right atrial pressure of 3 mmHg. FINDINGS  Left Ventricle: Left ventricular ejection fraction, by estimation, is 60 to 65%. The left ventricle has normal function. The left ventricle has no regional wall motion abnormalities. The left ventricular internal cavity size was normal in size. There is  no left ventricular hypertrophy. Left ventricular diastolic parameters are consistent with Grade I diastolic dysfunction (impaired  relaxation). Indeterminate filling pressures. Right Ventricle: The right ventricular size is normal. No increase in right ventricular wall thickness. Right ventricular systolic function is normal. Left Atrium: Left atrial size was normal in size. Right Atrium: Right atrial size was normal in size. Pericardium: There is no evidence of pericardial effusion. Mitral Valve: The mitral valve is grossly normal. Mild mitral annular calcification. Trivial mitral valve regurgitation. Tricuspid Valve: The tricuspid valve is grossly normal. Tricuspid valve regurgitation is not demonstrated. Aortic Valve: The aortic valve is tricuspid. Aortic valve regurgitation is not visualized. No aortic stenosis is present. Mild aortic valve annular calcification. Pulmonic Valve: The pulmonic valve was grossly normal. Pulmonic valve regurgitation is not visualized. Aorta: The aortic root is normal in size and structure. Venous: The inferior vena cava is normal in size with greater than 50% respiratory variability, suggesting right atrial pressure of  3 mmHg. IAS/Shunts: The interatrial septum was not well visualized.  LEFT VENTRICLE PLAX 2D LVIDd:         4.30 cm     Diastology LVIDs:         3.20 cm     LV e' lateral:   8.81 cm/s LV PW:         0.90 cm     LV E/e' lateral: 7.8 LV IVS:        0.90 cm     LV e' medial:    5.77 cm/s LVOT diam:     2.10 cm     LV E/e' medial:  11.9 LV SV:         87 LV SV Index:   45 LVOT Area:     3.46 cm  LV Volumes (MOD) LV vol d, MOD A2C: 72.8 ml LV vol d, MOD A4C: 87.8 ml LV vol s, MOD A2C: 27.4 ml LV vol s, MOD A4C: 33.2 ml LV SV MOD A2C:     45.4 ml LV SV MOD A4C:     87.8 ml LV SV MOD BP:      49.6 ml RIGHT VENTRICLE RV S prime:     11.30 cm/s TAPSE (M-mode): 1.8 cm LEFT ATRIUM             Index       RIGHT ATRIUM          Index LA diam:        4.00 cm 2.08 cm/m  RA Area:     8.99 cm LA Vol (A2C):   23.0 ml 11.97 ml/m RA Volume:   18.30 ml 9.53 ml/m LA Vol (A4C):   32.1 ml 16.71 ml/m LA Biplane  Vol: 28.2 ml 14.68 ml/m  AORTIC VALVE LVOT Vmax:   96.00 cm/s LVOT Vmean:  72.800 cm/s LVOT VTI:    0.252 m  AORTA Ao Root diam: 3.10 cm MITRAL VALVE MV Area (PHT): 2.62 cm    SHUNTS MV Decel Time: 289 msec    Systemic VTI:  0.25 m MV E velocity: 68.60 cm/s  Systemic Diam: 2.10 cm MV A velocity: 67.30 cm/s MV E/A ratio:  1.02 Prentice Docker MD Electronically signed by Prentice Docker MD Signature Date/Time: 12/30/2019/3:03:51 PM    Final     Cardiac Studies:  Assessment/Plan:  New onset angina MI ruled out mildly abnormal Lexiscan Myoview Coronary artery disease history of anteroseptal wall MI in the past status post PCI to LAD in 2015 Hypertension Hyperlipidemia Type 2 diabetes mellitus Positive family history of coronary artery disease Obesity Plan Discussed with patient at length regarding mildly abnormal nuclear stress test and various options of treatment i.e. medical versus invasive left cath possible PTCA stenting its risk and benefits i.e. death MI stroke need for emergency CABG local vascular complications and need for taking dual antiplatelet medications for at least 3 months etc. and consents for PCI.  LOS: 2 days    Rinaldo Cloud 12/31/2019, 3:46 PM

## 2019-12-31 NOTE — H&P (View-Only) (Signed)
Subjective:  Patient presently denies any chest pain or shortness of breath states he decreased his tired and fatigued and gets chest pain while working in the yard nuclear stress test showed possible mid septal and lateral wall ischemia with EF of 68%  Objective:  Vital Signs in the last 24 hours: Temp:  [97.7 F (36.5 C)-97.8 F (36.6 C)] 97.8 F (36.6 C) (05/24 0506) Pulse Rate:  [62-69] 69 (05/24 0506) Resp:  [16] 16 (05/24 0506) BP: (135-152)/(84-90) 140/85 (05/24 1028) SpO2:  [93 %-96 %] 93 % (05/24 0506) Weight:  [84.1 kg] 84.1 kg (05/24 0415)  Intake/Output from previous day: 05/23 0701 - 05/24 0700 In: -  Out: 800 [Urine:800] Intake/Output from this shift: Total I/O In: 180 [P.O.:180] Out: 400 [Urine:400]  Physical Exam: Neck: no adenopathy, no carotid bruit, no JVD and supple, symmetrical, trachea midline Lungs: clear to auscultation bilaterally Heart: regular rate and rhythm, S1, S2 normal and Soft systolic murmur noted Abdomen: soft, non-tender; bowel sounds normal; no masses,  no organomegaly Extremities: extremities normal, atraumatic, no cyanosis or edema  Lab Results: Recent Labs    12/30/19 0240 12/31/19 0408  WBC 6.6 5.4  HGB 13.6 13.9  PLT 179 165   Recent Labs    12/29/19 1827  NA 137  K 4.0  CL 102  CO2 25  GLUCOSE 247*  BUN 20  CREATININE 0.76   No results for input(s): TROPONINI in the last 72 hours.  Invalid input(s): CK, MB Hepatic Function Panel Recent Labs    12/29/19 1827  PROT 6.2*  ALBUMIN 3.5  AST 26  ALT 24  ALKPHOS 45  BILITOT 0.9  BILIDIR 0.1  IBILI 0.8   Recent Labs    12/29/19 2108  CHOL 260*   No results for input(s): PROTIME in the last 72 hours.  Imaging: Imaging results have been reviewed and NM Myocar Multi W/Spect W/Wall Motion / EF  Result Date: 12/31/2019 CLINICAL DATA:  Chest pain. Acute aortic syndrome suspected. History of diabetes and STEMI in 2015. EXAM: MYOCARDIAL IMAGING WITH SPECT (REST  AND PHARMACOLOGIC-STRESS) GATED LEFT VENTRICULAR WALL MOTION STUDY LEFT VENTRICULAR EJECTION FRACTION TECHNIQUE: Standard myocardial SPECT imaging was performed after resting intravenous injection of 10 mCi Tc-64m tetrofosmin. Subsequently, intravenous infusion of Lexiscan was performed under the supervision of the Cardiology staff. At peak effect of the drug, 30 mCi Tc-41m tetrofosmin was injected intravenously and standard myocardial SPECT imaging was performed. Quantitative gated imaging was also performed to evaluate left ventricular wall motion, and estimate left ventricular ejection fraction. COMPARISON:  Prior studies 02/17/2018 and 07/09/2016. FINDINGS: Perfusion: Compared with the prior study, there is possible new mild reversibility in the mid septum and mid lateral wall. The summed difference score is 12. No other fixed or reversible perfusion defects. Wall Motion: Normal left ventricular wall motion. No left ventricular dilation. Left Ventricular Ejection Fraction: 68% (previously 62%). End diastolic volume 62 ml End systolic volume 20 ml IMPRESSION: 1. Possible new areas reversibility in the mid septum and lateral wall, suspicious for myocardial ischemia. 2. Normal left ventricular wall motion. 3. Left ventricular ejection fraction 68% 4. Non invasive risk stratification*: Low *2012 Appropriate Use Criteria for Coronary Revascularization Focused Update: J Am Coll Cardiol. 2012;59(9):857-881. http://content.dementiazones.com.aspx?articleid=[email or web address] Electronically Signed   By: Carey Bullocks M.D.   On: 12/31/2019 14:10   DG Chest Portable 1 View  Result Date: 12/29/2019 CLINICAL DATA:  Chest pain. EXAM: PORTABLE CHEST 1 VIEW COMPARISON:  February 16, 2018 FINDINGS: The heart size and  mediastinal contours are within normal limits. Both lungs are clear. The visualized skeletal structures are unremarkable. IMPRESSION: No active disease. Electronically Signed   By: Dorise Bullion III M.D   On:  12/29/2019 18:37   ECHOCARDIOGRAM COMPLETE  Result Date: 12/30/2019    ECHOCARDIOGRAM REPORT   Patient Name:   [name] Date of Exam: 12/30/2019 Medical Rec #:  [id number]         Height:       65.0 in Accession #:    [id number]        Weight:       186.7 lb Date of Birth:  Sep 12, 1955          BSA:          1.921 m Patient Age:    64 years          BP:           120/79 mmHg Patient Gender: F                 HR:           66 bpm. Exam Location:  Inpatient Procedure: 2D Echo, Cardiac Doppler and Color Doppler Indications:    Chest Pain 786.50 / R07.9  History:        Patient has no prior history of Echocardiogram examinations. CAD                 and Previous Myocardial Infarction, Signs/Symptoms:Chest Pain;                 Risk Factors:Non-Smoker.  Sonographer:    Vickie Epley RDCS Referring Phys: [id number] ROBIN Cochran  1. Left ventricular ejection fraction, by estimation, is 60 to 65%. The left ventricle has normal function. The left ventricle has no regional wall motion abnormalities. Left ventricular diastolic parameters are consistent with Grade I diastolic dysfunction (impaired relaxation).  2. Right ventricular systolic function is normal. The right ventricular size is normal.  3. The mitral valve is grossly normal. Trivial mitral valve regurgitation.  4. The aortic valve is tricuspid. Aortic valve regurgitation is not visualized. No aortic stenosis is present.  5. The inferior vena cava is normal in size with greater than 50% respiratory variability, suggesting right atrial pressure of 3 mmHg. FINDINGS  Left Ventricle: Left ventricular ejection fraction, by estimation, is 60 to 65%. The left ventricle has normal function. The left ventricle has no regional wall motion abnormalities. The left ventricular internal cavity size was normal in size. There is  no left ventricular hypertrophy. Left ventricular diastolic parameters are consistent with Grade I diastolic dysfunction (impaired  relaxation). Indeterminate filling pressures. Right Ventricle: The right ventricular size is normal. No increase in right ventricular wall thickness. Right ventricular systolic function is normal. Left Atrium: Left atrial size was normal in size. Right Atrium: Right atrial size was normal in size. Pericardium: There is no evidence of pericardial effusion. Mitral Valve: The mitral valve is grossly normal. Mild mitral annular calcification. Trivial mitral valve regurgitation. Tricuspid Valve: The tricuspid valve is grossly normal. Tricuspid valve regurgitation is not demonstrated. Aortic Valve: The aortic valve is tricuspid. Aortic valve regurgitation is not visualized. No aortic stenosis is present. Mild aortic valve annular calcification. Pulmonic Valve: The pulmonic valve was grossly normal. Pulmonic valve regurgitation is not visualized. Aorta: The aortic root is normal in size and structure. Venous: The inferior vena cava is normal in size with greater than 50% respiratory variability, suggesting right atrial pressure of  3 mmHg. IAS/Shunts: The interatrial septum was not well visualized.  LEFT VENTRICLE PLAX 2D LVIDd:         4.30 cm     Diastology LVIDs:         3.20 cm     LV e' lateral:   8.81 cm/s LV PW:         0.90 cm     LV E/e' lateral: 7.8 LV IVS:        0.90 cm     LV e' medial:    5.77 cm/s LVOT diam:     2.10 cm     LV E/e' medial:  11.9 LV SV:         87 LV SV Index:   45 LVOT Area:     3.46 cm  LV Volumes (MOD) LV vol d, MOD A2C: 72.8 ml LV vol d, MOD A4C: 87.8 ml LV vol s, MOD A2C: 27.4 ml LV vol s, MOD A4C: 33.2 ml LV SV MOD A2C:     45.4 ml LV SV MOD A4C:     87.8 ml LV SV MOD BP:      49.6 ml RIGHT VENTRICLE RV S prime:     11.30 cm/s TAPSE (M-mode): 1.8 cm LEFT ATRIUM             Index       RIGHT ATRIUM          Index LA diam:        4.00 cm 2.08 cm/m  RA Area:     8.99 cm LA Vol (A2C):   23.0 ml 11.97 ml/m RA Volume:   18.30 ml 9.53 ml/m LA Vol (A4C):   32.1 ml 16.71 ml/m LA Biplane  Vol: 28.2 ml 14.68 ml/m  AORTIC VALVE LVOT Vmax:   96.00 cm/s LVOT Vmean:  72.800 cm/s LVOT VTI:    0.252 m  AORTA Ao Root diam: 3.10 cm MITRAL VALVE MV Area (PHT): 2.62 cm    SHUNTS MV Decel Time: 289 msec    Systemic VTI:  0.25 m MV E velocity: 68.60 cm/s  Systemic Diam: 2.10 cm MV A velocity: 67.30 cm/s MV E/A ratio:  1.02 Prentice Docker MD Electronically signed by Prentice Docker MD Signature Date/Time: 12/30/2019/3:03:51 PM    Final     Cardiac Studies:  Assessment/Plan:  New onset angina MI ruled out mildly abnormal Lexiscan Myoview Coronary artery disease history of anteroseptal wall MI in the past status post PCI to LAD in 2015 Hypertension Hyperlipidemia Type 2 diabetes mellitus Positive family history of coronary artery disease Obesity Plan Discussed with patient at length regarding mildly abnormal nuclear stress test and various options of treatment i.e. medical versus invasive left cath possible PTCA stenting its risk and benefits i.e. death MI stroke need for emergency CABG local vascular complications and need for taking dual antiplatelet medications for at least 3 months etc. and consents for PCI.  LOS: 2 days    Rinaldo Cloud 12/31/2019, 3:46 PM

## 2020-01-01 ENCOUNTER — Encounter (HOSPITAL_COMMUNITY)
Admission: EM | Disposition: A | Discharge status: Home / Self Care | Payer: Self-pay | Source: Home / Self Care | Attending: Cardiology

## 2020-01-01 HISTORY — PX: LEFT HEART CATH AND CORONARY ANGIOGRAPHY: CATH118249

## 2020-01-01 LAB — CBC
HCT: 43.7 % (ref 36.0–46.0)
Hemoglobin: 14.8 g/dL (ref 12.0–15.0)
MCH: 30.8 pg (ref 26.0–34.0)
MCHC: 33.9 g/dL (ref 30.0–36.0)
MCV: 90.9 fL (ref 80.0–100.0)
Platelets: 164 10*3/uL (ref 150–400)
RBC: 4.81 MIL/uL (ref 3.87–5.11)
RDW: 12.7 % (ref 11.5–15.5)
WBC: 5.1 10*3/uL (ref 4.0–10.5)
nRBC: 0 % (ref 0.0–0.2)

## 2020-01-01 LAB — GLUCOSE, CAPILLARY
Glucose-Capillary: 104 mg/dL — ABNORMAL HIGH (ref 70–99)
Glucose-Capillary: 101 mg/dL — ABNORMAL HIGH (ref 70–99)

## 2020-01-01 LAB — POCT ACTIVATED CLOTTING TIME: Activated Clotting Time: 120 seconds

## 2020-01-01 LAB — HEPARIN LEVEL (UNFRACTIONATED): Heparin Unfractionated: <0.1 IU/mL — ABNORMAL LOW (ref 0.30–0.70)

## 2020-01-01 SURGERY — LEFT HEART CATH AND CORONARY ANGIOGRAPHY
Anesthesia: LOCAL

## 2020-01-01 MED ORDER — FENTANYL CITRATE (PF) 100 MCG/2ML IJ SOLN
INTRAMUSCULAR | Status: AC
  Filled 2020-01-01: qty 2

## 2020-01-01 MED ORDER — MIDAZOLAM HCL 2 MG/2ML IJ SOLN
INTRAMUSCULAR | Status: DC | PRN
  Administered 2020-01-01 (×2): 1 mg via INTRAVENOUS

## 2020-01-01 MED ORDER — SODIUM CHLORIDE 0.9 % IV SOLN: INTRAVENOUS | Status: AC

## 2020-01-01 MED ORDER — IOHEXOL 350 MG/ML SOLN
INTRAVENOUS | Status: DC | PRN
  Administered 2020-01-01: 70 mL via INTRA_ARTERIAL

## 2020-01-01 MED ORDER — SODIUM CHLORIDE 0.9% FLUSH: 3.0000 mL | Freq: Two times a day (BID) | INTRAVENOUS | Status: DC

## 2020-01-01 MED ORDER — FENTANYL CITRATE (PF) 100 MCG/2ML IJ SOLN
INTRAMUSCULAR | Status: DC | PRN
  Administered 2020-01-01 (×2): 25 ug via INTRAVENOUS

## 2020-01-01 MED ORDER — SODIUM CHLORIDE 0.9 % IV SOLN: 250.0000 mL | INTRAVENOUS | Status: DC | PRN

## 2020-01-01 MED ORDER — LIDOCAINE HCL (PF) 1 % IJ SOLN
INTRAMUSCULAR | Status: AC
  Filled 2020-01-01: qty 30

## 2020-01-01 MED ORDER — MIDAZOLAM HCL 2 MG/2ML IJ SOLN
INTRAMUSCULAR | Status: AC
  Filled 2020-01-01: qty 2

## 2020-01-01 MED ORDER — SODIUM CHLORIDE 0.9% FLUSH: 3.0000 mL | INTRAVENOUS | Status: DC | PRN

## 2020-01-01 MED ORDER — LIDOCAINE HCL (PF) 1 % IJ SOLN
INTRAMUSCULAR | Status: DC | PRN
  Administered 2020-01-01: 30 mL via INTRADERMAL

## 2020-01-01 MED ORDER — HEPARIN (PORCINE) IN NACL 1000-0.9 UT/500ML-% IV SOLN
INTRAVENOUS | Status: AC
  Filled 2020-01-01: qty 1000

## 2020-01-01 MED ORDER — SODIUM CHLORIDE 0.9 % IV SOLN
INTRAVENOUS | Status: AC | PRN
  Administered 2020-01-01: 250 mL via INTRAVENOUS

## 2020-01-01 SURGICAL SUPPLY — 7 items
CATH INFINITI 5FR MULTPACK ANG (CATHETERS) ×1 IMPLANT
KIT HEART LEFT (KITS) ×2 IMPLANT
PACK CARDIAC CATHETERIZATION (CUSTOM PROCEDURE TRAY) ×2 IMPLANT
SHEATH PINNACLE 5F 10CM (SHEATH) ×1 IMPLANT
SYR MEDRAD MARK 7 150ML (SYRINGE) ×2 IMPLANT
TRANSDUCER W/STOPCOCK (MISCELLANEOUS) ×2 IMPLANT
WIRE EMERALD 3MM-J .035X150CM (WIRE) ×1 IMPLANT

## 2020-01-01 NOTE — Discharge Summary (Signed)
Discharge summary dictated on 01/01/20, dictation number is [phone]

## 2020-01-01 NOTE — Progress Notes (Signed)
Site area: Right groin a 5 french arterial sheath was removed by Lucille Passy RN  2H  Site Prior to Removal:  Level 0  Pressure Applied For 20 MINUTES    Bedrest Beginning at 0900am  Manual:   Yes.    Patient Status During Pull:  stable  Post Pull Groin Site:  Level 0  Post Pull Instructions Given:  Yes.    Post Pull Pulses Present:  Yes.    Dressing Applied:  Yes.    Comments:

## 2020-01-01 NOTE — Interval H&P Note (Signed)
Cath Lab Visit (complete for each Cath Lab visit)  Clinical Evaluation Leading to the Procedure:   ACS: No.  Non-ACS:    Anginal Classification: CCS III  Anti-ischemic medical therapy: Maximal Therapy (2 or more classes of medications)  Non-Invasive Test Results: Low-risk stress test findings: cardiac mortality <1%/year  Prior CABG: No previous CABG      History and Physical Interval Note:  01/01/2020 7:29 AM  [name]  has presented today for surgery, with the diagnosis of chest pain.  The various methods of treatment have been discussed with the patient and family. After consideration of risks, benefits and other options for treatment, the patient has consented to  Procedure(s): LEFT HEART CATH AND CORONARY ANGIOGRAPHY (N/A) as a surgical intervention.  The patient's history has been reviewed, patient examined, no change in status, stable for surgery.  I have reviewed the patient's chart and labs.  Questions were answered to the patient's satisfaction.     Rinaldo Cloud

## 2020-01-01 NOTE — Discharge Instructions (Signed)
Angina  Angina is very bad discomfort or pain in the chest, neck, arm, jaw, or back. The discomfort is caused by a lack of blood in the middle layer of the heart wall (myocardium). What are the causes? This condition is caused by a buildup of fat and cholesterol (plaque) in your arteries (atherosclerosis). This buildup narrows the arteries and makes it hard for blood to flow. What increases the risk? You are more likely to develop this condition if:  You have high levels of cholesterol in your blood.  You have high blood pressure (hypertension).  You have diabetes.  You have a family history of heart disease.  You are not active, or you do not exercise enough.  You feel sad (depressed).  You have been treated with high energy rays (radiation) on the left side of your chest. Other risk factors are:  Using tobacco.  Being very overweight (obese).  Eating a diet high in unhealthy fats (saturated fats).  Having stress, or being exposed to things that cause stress.  Using drugs, such as cocaine. Women have a greater risk for angina if:  They are older than 55.  They have stopped having their period (are in postmenopause). What are the signs or symptoms? Common symptoms of this condition in both men and women may include:  Chest pain, which may: ? Feel like a crushing or squeezing in the chest. ? Feel like a tightness, pressure, fullness, or heaviness in the chest. ? Last for more than a few minutes at a time. ? Stop and come back (recur) after a few minutes.  Pain in the neck, arm, jaw, or back.  Heartburn or upset stomach (indigestion) for no reason.  Being short of breath.  Feeling sick to your stomach (nauseous).  Sudden cold sweats. Women and people with diabetes may have other symptoms that are not usual, such as feeling:  Tired (fatigue).  Worried or nervous (anxious) for no reason.  Weak for no reason.  Dizzy or passing out (fainting). How is this  treated? This condition may be treated with:  Medicines. These are given to: ? Prevent blood clots. ? Prevent heart attack. ? Relax blood vessels and improve blood flow to the heart (nitrates). ? Reduce blood pressure. ? Improve the pumping action of the heart. ? Reduce fat and cholesterol in the blood.  A procedure to widen a narrowed or blocked artery in the heart (angioplasty).  Surgery to allow blood to go around a blocked artery (coronary artery bypass surgery). Follow these instructions at home: Medicines  Take over-the-counter and prescription medicines only as told by your doctor.  Do not take these medicines unless your doctor says that you can: ? NSAIDs. These include:  Ibuprofen.  Naproxen. ? Vitamin supplements that have vitamin A, vitamin E, or both. ? Hormone therapy that contains estrogen with or without progestin. Eating and drinking   Eat a heart-healthy diet that includes: ? Lots of fresh fruits and vegetables. ? Whole grains. ? Low-fat (lean) protein. ? Low-fat dairy products.  Follow instructions from your doctor about what you cannot eat or drink. Activity  Follow an exercise program that your doctor tells you.  Talk with your doctor about joining a program to help improve the health of your heart (cardiac rehab).  When you feel tired, take a break. Plan breaks if you know you are going to feel tired. Lifestyle   Do not use any products that contain nicotine or tobacco. This includes cigarettes, e-cigarettes, and   chewing tobacco. If you need help quitting, ask your doctor.  If your doctor says you can drink alcohol: ? Limit how much you use to:  0-1 drink a day for women who are not pregnant.  0-2 drinks a day for men. ? Be aware of how much alcohol is in your drink. In the [place] one drink equals:  One 12 oz bottle of beer (355 mL).  One 5 oz glass of wine (148 mL).  One 1 oz glass of hard liquor (44 mL). General instructions  Stay  at a healthy weight. If your doctor tells you to do so, work with him or her to lose weight.  Learn to deal with stress. If you need help, ask your doctor.  Keep your vaccines up to date. Get a flu shot every year.  Talk with your doctor if you feel sad. Take a screening test to see if you are at risk for depression.  Work with your doctor to manage any other health problems that you have. These may include diabetes or high blood pressure.  Keep all follow-up visits as told by your doctor. This is important. Get help right away if:  You have pain in your chest, neck, arm, jaw, or back, and the pain: ? Lasts more than a few minutes. ? Comes back. ? Does not get better after you take medicine under your tongue (sublingual nitroglycerin). ? Keeps getting worse. ? Comes more often.  You have any of these problems for no reason: ? Sweating a lot. ? Heartburn or upset stomach. ? Shortness of breath. ? Trouble breathing. ? Feeling sick to your stomach. ? Throwing up (vomiting). ? Feeling more tired than normal. ? Feeling nervous or worrying more than normal. ? Weakness.  You are suddenly dizzy or light-headed.  You pass out. These symptoms may be an emergency. Do not wait to see if the symptoms will go away. Get medical help right away. Call your local emergency services (911 in the [place] Do not drive yourself to the hospital. Summary  Angina is very bad discomfort or pain in the chest, neck, arm, neck, or back.  Symptoms include chest pain, heartburn or upset stomach for no reason, and shortness of breath.  Women or people with diabetes may have symptoms that are not usual, such as feeling nervous or worried for no reason, weak for no reason, or tired.  Take all medicines only as told by your doctor.  You should eat a heart-healthy diet and follow an exercise program. This information is not intended to replace advice given to you by your health care provider. Make sure you  discuss any questions you have with your health care provider. Document Revised: 03/13/2018 Document Reviewed: 03/13/2018 Elsevier Patient Education  [employer].  Coronary Angiogram A coronary angiogram is an X-ray procedure that is used to examine the arteries in the heart. Contrast dye is injected through a long, thin tube (catheter) into these arteries. Then X-rays are taken to show any blockage in these arteries. You may have this procedure if you:  Are having chest pain, or other symptoms of angina, and you are at risk for heart disease.  Have an abnormal stress test or test of your heart's electrical activity (electrocardiogram, or ECG).  Have chest pain and heart failure.  Are having irregular heart rhythms. A coronary angiogram or heart catheterization can show if you have valve disease or a disease of the aorta. This procedure can also be used to  check the overall function of your heart muscle. Let your health care provider know about:  Any allergies you have, including allergies to medicines or contrast dye.  All medicines you are taking, including vitamins, herbs, eye drops, creams, and over-the-counter medicines.  Any problems you or family members have had with anesthetic medicines.  Any blood disorders you have.  Any surgeries you have had.  Any history of kidney problems or kidney failure.  Any medical conditions you have.  Whether you are pregnant or may be pregnant.  Whether you are breastfeeding. What are the risks? Generally, this is a safe procedure. However, problems may occur, including:  Infection.  Allergic reaction to medicines or dyes that are used.  Bleeding from the insertion site or other places.  Damage to nearby structures, such as blood vessels, or damage to kidneys from contrast dye.  Irregular heart rhythms.  Stroke (rare).  Heart attack (rare). What happens before the procedure? Staying hydrated Follow instructions from  your health care provider about hydration, which may include:  Up to 2 hours before the procedure - you may continue to drink clear liquids, such as water, clear fruit juice, black coffee, and plain tea.  Eating and drinking restrictions Follow instructions from your health care provider about eating and drinking, which may include:  8 hours before the procedure - stop eating heavy meals or foods, such as meat, fried foods, or fatty foods.  6 hours before the procedure - stop eating light meals or foods, such as toast or cereal.  6 hours before the procedure - stop drinking milk or drinks that contain milk.  2 hours before the procedure - stop drinking clear liquids. Medicines Ask your health care provider about:  Changing or stopping your regular medicines. This is especially important if you are taking diabetes medicines or blood thinners.  Taking medicines such as aspirin and ibuprofen. These medicines can thin your blood. Do not take these medicines unless your health care provider tells you to take them. Aspirin may be recommended before coronary angiograms even if you do not normally take it.  Taking over-the-counter medicines, vitamins, herbs, and supplements. General instructions  Do not use any products that contain nicotine or tobacco for at least 4 weeks before the procedure. These products include cigarettes, e-cigarettes, and chewing tobacco. If you need help quitting, ask your health care provider.  You may have an exam or testing.  Plan to have someone take you home from the hospital or clinic.  If you will be going home right after the procedure, plan to have someone with you for 24 hours.  Ask your health care provider: ? How your insertion site will be marked. ? What steps will be taken to help prevent infection. These may include:  Removing hair at the insertion site.  Washing skin with a germ-killing soap.  Taking antibiotic medicine. What happens during  the procedure?   You will lie on your back on an X-ray table.  An IV will be inserted into one of your veins.  Electrodes will be placed on your chest.  You will be given one or more of the following: ? A medicine to help you relax (sedative). ? A medicine to numb the catheter insertion area (local anesthetic).  You will be connected to a continuous ECG monitor.  The catheter will be inserted into an artery in one of these areas: ? Your groin area in your upper thigh. ? Your wrist. ? The fold of your  arm, near your elbow.  An X-ray procedure (fluoroscopy) will be used to help guide the catheter to the opening of the blood vessel to be used.  A dye will be injected into the catheter and X-rays will be taken. The dye will help to show any narrowing or blockages in the heart arteries.  Tell your health care provider if you have chest pain or trouble breathing.  If blockages are found, another procedure may be done to open the artery.  The catheter will be removed after the fluoroscopy is complete.  A bandage (dressing) will be placed over the insertion site. Pressure will be applied to stop bleeding.  The IV will be removed. The procedure may vary among health care providers and hospitals. What happens after the procedure?  Your blood pressure, heart rate, breathing rate, and blood oxygen level will be monitored until you leave the hospital or clinic.  You will need to lie still for a few hours, or for as long as told by your health care provider. ? If the procedure is done through the groin, you will be told not to bend or cross your legs.  The insertion site and the pulse in your foot or wrist will be checked often.  More blood tests, X-rays, and an ECG may be done.  Do not drive for 24 hours if you were given a sedative during your procedure. Summary  A coronary angiogram is an X-ray procedure that is used to examine the arteries in the heart.  Contrast dye is  injected through a long, thin tube (catheter) into each artery.  Tell your health care provider about any allergies you have, including allergies to contrast dye.  After the procedure, you will need to lie still for a few hours and drink plenty of fluids. This information is not intended to replace advice given to you by your health care provider. Make sure you discuss any questions you have with your health care provider. Document Revised: 02/15/2019 Document Reviewed: 02/15/2019 Elsevier Patient Education  2020 [employer].

## 2020-01-01 NOTE — Plan of Care (Signed)
Min assist with adls, bedrest completed. Cath site intact, no bleeding, no hematoma. Voided without difficulty.

## 2020-01-02 MED FILL — Heparin Sod (Porcine)-NaCl IV Soln 1000 Unit/500ML-0.9%: INTRAVENOUS | Qty: 1000 | Status: AC

## 2020-01-02 NOTE — Discharge Summary (Signed)
NAME: [name], [name] MEDICAL RECORD [id number] ACCOUNT [id number] DATE OF BIRTH:[birth year] FACILITY: [facility] LOCATION: [facility] PHYSICIAN:Terran Klinke Jeoffrey Massed, MD  DISCHARGE SUMMARY  DATE OF DISCHARGE:  01/01/2020  ADMITTING DIAGNOSES: 1.  Typical angina, new onset angina, unstable angina, rule out myocardial infarction. 2.  Coronary artery disease status post percutaneous coronary intervention to proximal and mid left anterior descending in 2015 in the setting of anterior ST-elevation myocardial infarction, hypertension. 3.  Hyperlipidemia, morbid obesity, chronic back pain.  DISCHARGE DIAGNOSES: 1.  Unstable angina, myocardial infarction ruled out.  Mildly abnormal nuclear stress test, status post cardiac catheterization, noted to have small vessel disease with critical stenosis and nondominant mid right coronary artery.   2.  Hypertension. 3.  Coronary artery disease, history of anterolateral wall myocardial infarction in July of 2015 in the setting of ST-elevation myocardial infarction status post percutaneous coronary intervention to LAD noted to have patent stent. 4.  Hypertension. 5.  Diabetes mellitus. 6.  Hyperlipidemia. 7.  Morbid obesity. 8.  Positive family history of coronary artery disease. 9.  Gastroesophageal reflux disease. 10.  Degenerative joint disease.  DISCHARGE HOME MEDICATIONS: 1.  Amlodipine 2.5 mg 1 tablet daily. 2.  Aspirin 81 mg daily. 3.  Zetia 10 mg daily. 4.  Lantus insulin 25 units twice daily. 5.  Metoprolol tartrate 12.5 mg twice daily. 6.  Nitrostat 0.4 mg sublingual use as directed. 7.  Ramipril 5 mg daily.  DIET:  Low salt, low cholesterol, 1800 calories ADA diet.  Post-cardiac catheterization instructions have been given.  Follow up with me in 1 week.  We will discuss with the patient regarding starting PCSK9 inhibitor as outpatient.  The patient's lipid remain elevated, has refused to take any medications  including PCSK9 inhibitor as  outpatient for hyperlipidemia.  CONDITION AT DISCHARGE:  Stable.  Post-cardiac catheterization instructions have been given.  BRIEF HISTORY AND HOSPITAL COURSE:  The patient is a 64 year old female with past medical history significant for coronary artery disease, status post STEMI in July of 2015, status post proximal to mid LAD stenting, hypertension, hyperlipidemia, diabetes  mellitus, morbid obesity.  She presents to the ER with complaints of chest pain.  The patient reports ongoing exertional chest pain since 4 weeks, but today while she was working in the garden, had worsening chest pain, squeezing in nature, radiating to  left arm and back, associated with mild diaphoresis and got somewhat improved on rest, but persisted; thus, needing to call 911.  She was instructed to take aspirin as well as nitroglycerin with complete resolution of chest pain.  When presented to ER,  she was chest pain free.  ER workup was largely negative.  EKG shows nonspecific ST-T-wave changes.  Troponins were negative.  The patient is chest pain free and hemodynamically stable.  She denies any chest pain at the time of admission or any  associated symptoms.  She has been also dealing with some back issues and neck issues likely degenerative joint disease for which she anticipates getting some sort of procedure in the near future.  In 2019, she presented with near syncope and chest pain  for which she underwent nuclear stress test which was negative for ischemia.  PHYSICAL EXAMINATION: GENERAL:  She was alert, awake, oriented x3.  No acute distress. VITAL SIGNS:  Blood pressure was 105/77, pulse 69 and regular.  She was afebrile. HEENT:  Conjunctivae pink. NECK:  Supple, no JVD, no bruit. LUNGS:  Clear to auscultation without rhonchi or rales. CARDIOVASCULAR:  S1, S2  was normal. ABDOMEN:  Soft.  Bowel sounds present, nontender. EXTREMITIES:  There is no clubbing, cyanosis or edema.  LABORATORY DATA:  Sodium was  137, potassium 4.0, glucose 247, BUN 20, creatinine 0.76.  Her high sensitivity troponin I was 8, 10 and 9.  Her cholesterol was markedly elevated 260, HDL was low at 37, LDL 171, triglycerides 261.  Hemoglobin was 13.7,  hematocrit 40.7, white count of 5.7.  Her hemoglobin A1c was also elevated at 7.9.  Chest x-ray showed no active disease.  She had a nuclear stress test which showed possible new areas of reversibility in the mid septum and lateral wall suspicious for  myocardial ischemia, EF 68%.  BRIEF HOSPITAL COURSE:  The patient was admitted to telemetry unit.  MI was ruled out by serial enzymes and EKG.  The patient subsequently underwent nuclear stress test as above.  Discussed with the patient at length various options of treatment, i.e.  medical versus invasive left catheterization, possible PTCA stenting, its risks and benefits and consented for PCI.  The patient underwent left cardiac catheterization today, tolerated the procedure well.  The patient noted to have critical mid RCA  stenosis, which is nondominant, small vessel and was felt not suitable for PCI.  The patient will be treated medically.  The patient did not have any episodes of chest pain during the hospital stay.  Her groin is stable.  The patient was given  instructions regarding diet, lifestyle changes, compliance with medication, followup, and also discussed at length regarding starting PCSK9 elevator, which she said she will think about it.  We will discuss further as outpatient.  DISCHARGE INSTRUCTIONS:  The patient will be discharged home on above medications and will be followed up in my office in 1 week.  CN/NUANCE D:01/01/2020 T:01/02/2020 JOB:[id number]

## 2020-10-22 ENCOUNTER — Encounter: Payer: Self-pay | Admitting: Gastroenterology

## 2021-02-11 ENCOUNTER — Other Ambulatory Visit: Payer: Self-pay | Admitting: Obstetrics & Gynecology

## 2021-02-11 DIAGNOSIS — R928 Other abnormal and inconclusive findings on diagnostic imaging of breast: Secondary | ICD-10-CM

## 2021-03-02 ENCOUNTER — Ambulatory Visit
Admission: RE | Admit: 2021-03-02 | Discharge: 2021-03-02 | Disposition: A | Discharge status: Home / Self Care | Payer: BC Managed Care – PPO | Source: Ambulatory Visit | Attending: Obstetrics & Gynecology | Admitting: Obstetrics & Gynecology

## 2021-03-02 ENCOUNTER — Other Ambulatory Visit: Payer: Self-pay

## 2021-03-02 ENCOUNTER — Other Ambulatory Visit: Payer: Self-pay | Admitting: Obstetrics & Gynecology

## 2021-03-02 DIAGNOSIS — N6320 Unspecified lump in the left breast, unspecified quadrant: Secondary | ICD-10-CM

## 2021-03-02 DIAGNOSIS — R928 Other abnormal and inconclusive findings on diagnostic imaging of breast: Secondary | ICD-10-CM

## 2021-03-11 ENCOUNTER — Other Ambulatory Visit: Payer: BC Managed Care – PPO

## 2021-03-25 ENCOUNTER — Other Ambulatory Visit: Payer: Self-pay | Admitting: Obstetrics & Gynecology

## 2021-03-25 ENCOUNTER — Ambulatory Visit
Admission: RE | Admit: 2021-03-25 | Discharge: 2021-03-25 | Disposition: A | Discharge status: Home / Self Care | Payer: BC Managed Care – PPO | Source: Ambulatory Visit | Attending: Obstetrics & Gynecology | Admitting: Obstetrics & Gynecology

## 2021-03-25 ENCOUNTER — Other Ambulatory Visit: Payer: Self-pay

## 2021-03-25 DIAGNOSIS — N6320 Unspecified lump in the left breast, unspecified quadrant: Secondary | ICD-10-CM

## 2021-10-29 ENCOUNTER — Encounter: Payer: Self-pay | Admitting: Gastroenterology

## 2021-11-27 ENCOUNTER — Encounter: Payer: Self-pay | Admitting: Gastroenterology

## 2021-11-27 ENCOUNTER — Ambulatory Visit (INDEPENDENT_AMBULATORY_CARE_PROVIDER_SITE_OTHER): Payer: BC Managed Care – PPO | Admitting: Gastroenterology

## 2021-11-27 VITALS — BP 110/82 | HR 60 | Ht 65.0 in | Wt 195.4 lb

## 2021-11-27 DIAGNOSIS — Z86010 Personal history of colonic polyps: Secondary | ICD-10-CM

## 2021-11-27 NOTE — Patient Instructions (Signed)
?If you are age 66 or older, your body mass index should be between 23-30. Your Body mass index is 32.51 kg/m?Marland Kitchen If this is out of the aforementioned range listed, please consider follow up with your Primary Care Provider. ? ?If you are age 45 or younger, your body mass index should be between 19-25. Your Body mass index is 32.51 kg/m?Marland Kitchen If this is out of the aformentioned range listed, please consider follow up with your Primary Care Provider.  ? ?________________________________________________________ ? ?The [facility] providers would like to encourage you to use [facility] to communicate with providers for non-urgent requests or questions.  Due to long hold times on the telephone, sending your provider a message by [facility] may be a faster and more efficient way to get a response.  Please allow 48 business hours for a response.  Please remember that this is for non-urgent requests.  ?_______________________________________________________ ? ?You have been scheduled for a colonoscopy. Please follow written instructions given to you at your visit today.  ?Please pick up your prep supplies at the pharmacy within the next 1-3 days. ?If you use inhalers (even only as needed), please bring them with you on the day of your procedure. ? ?You will be contacted by our office prior to your procedure for directions on holding your Plavix.  If you do not hear from our office 1 week prior to your scheduled procedure, please call [phone] to discuss.  ? ?We have given you samples of the following medication to take: ?Clenpiq ? ?Please call with any question or concerns. ? ?Thank you, ? ?Dr. Lynann Bologna ? ? ? ? ? ?We want to thank you for trusting [facility] with your care. All of our staff and providers value the relationships we have built with our patients, and it is an honor to care for you.  ? ?We are writing to let you know that [facility] will close on Dec 21, 2021, and  we invite you to continue to see Dr. Edman Circle and Doristine Locks at the [facility] [place] office location. We are consolidating our serices at these [facility] practices to better provide care. Our office staff will work with you to ensure a seamless transition.  ? ?Doristine Locks, DO -Dr. Barron Alvine will be movig to [facility] at [address] [place], [place] [address], effective Dec 21, 2021.  Contact [phone] to schedule an appointment with him.  ? ?Edman Circle, MD- Dr. Chales Abrahams will be movig to [facility] at [address], [place], [place] [address], effective Dec 21, 2021.  Contact [phone] to schedule an appointment with him.  ? ?Requesting Medical Records ?If you need to request your medical records, please follow the instructions below. Your medical records are confidential, and a copy can be transferred to another provider or released to you or another person you designate only with your permission. ? ?There are several ways to request your medical records: ?Requests for medical records can be submitted through our practice.   ?You can also request your records electronically, in your MyChart account by selecting the ?Request Health Records? tab.  ?If you need additional information on how to request records, please go to [email or web address], choose Patient Information, then select Request Medical Records. ?To make an appointment or if you have any questions about your health care needs, please contact our office at [phone] and one of our staff members will be glad to assist you. ?[facility] is  committed to providing exceptional care for you and our community. Thank you for allowing [place] to serve your health care needs. ?Sincerely, ? ?Trixie Dredge, Director [facility] ?[facility] also offers convenient virtual care options. Sore throat? Sinus problems? Cold or flu symptoms? Get care from the comfort of home with [facility] Video  Visits and e-Visits. Learn more about the non-emergency conditions treated and start your virtual visit at [email or web address] ? ?

## 2021-11-27 NOTE — Progress Notes (Signed)
? ? ?Chief Complaint: For colonoscopy ? ?Referring Provider:  Hadley Pen, MD    ? ? ?ASSESSMENT AND PLAN;  ? ?#1. H/O polyps ? ? ?Plan: ?-Colon after cardio clearence from Dr Lorayne Marek, off plavix x 5 days. Continue ASA ? ? ?Discussed risks & benefits of colonoscopy. Risks including rare perforation req laparotomy, bleeding after bx/polypectomy req blood transfusion, rarely missing neoplasms, risks of anesthesia/sedation, rare risk of damage to internal organs. Benefits outweigh the risks. Patient agrees to proceed. All the questions were answered. Pt consents to proceed. ?HPI:   ? ?[name] is a 66 y.o. female  ?With CAD s/p DES 2016 on ASA/Plavix, HLD, DM2, HTN, OA ? ?No nausea, vomiting, heartburn (occ TUMS), regurgitation, odynophagia or dysphagia.  No significant diarrhea.  No melena or hematochezia. No unintentional weight loss. No abdominal pain. ? ?Occ constipation, better with increased water. ? ?Previous GI work-up: ?Colonoscopy 08/07/2015 ([facility]) ?-Colonic polyp s/p polypectomy ?-Pancolonic diverticulosis predominantly in the sigmoid colon. ?-Repeat in 5 years ? ?SH-single, interior designer ?Past Medical History:  ?Diagnosis Date  ? Arthritis   ? "qwhere" (02/16/2018)  ? Chest pain due to CAD (HCC) 12/29/2019  ? GERD (gastroesophageal reflux disease)   ? Heart murmur   ? High cholesterol   ? History of hiatal hernia dx'd 1979  ? History of kidney stones   ? "passed them"  ? Hypertension   ? Myocardial infarct (HCC) 02/2014  ? Type II diabetes mellitus (HCC)   ? fasting blood sugars 150s  ? ? ?Past Surgical History:  ?Procedure Laterality Date  ? ANTERIOR CERVICAL DECOMP/DISCECTOMY FUSION N/A 11/20/2012  ? Procedure: ANTERIOR CERVICAL DECOMPRESSION/DISCECTOMY FUSION 1 LEVEL;  Surgeon: Hewitt Shorts, MD;  Location: [facility];  Service: Neurosurgery;  Laterality: N/A;  Cervical four - five  anterior cervical decompression with fusion plating and bonegraft  ? BACK SURGERY    ? BASAL CELL  CARCINOMA EXCISION    ? "took off in the office; off my face" (02/16/2018)  ? CATARACT EXTRACTION W/ INTRAOCULAR LENS  IMPLANT, BILATERAL Bilateral   ? COLONOSCOPY  08/07/2015  ? Colonic polyp status post polypectomy. Pancolonic diverticulosis predominantly in the sigmoid colon  ? LEFT HEART CATH AND CORONARY ANGIOGRAPHY N/A 01/01/2020  ? Procedure: LEFT HEART CATH AND CORONARY ANGIOGRAPHY;  Surgeon: Rinaldo Cloud, MD;  Location: [facility];  Service: Cardiovascular;  Laterality: N/A;  ? LEFT HEART CATHETERIZATION WITH CORONARY ANGIOGRAM N/A 03/05/2014  ? Procedure: LEFT HEART CATHETERIZATION WITH CORONARY ANGIOGRAM;  Surgeon: Robynn Pane, MD;  Location: [facility];  Service: Cardiovascular;  Laterality: N/A;  ? NECK SURGERY  2012  ? stent placement 2015    ? TONSILLECTOMY    ? ? ?Family History  ?Problem Relation Age of Onset  ? Diabetes Mother   ? Heart attack Mother   ? Emphysema Father   ? Colon cancer Neg Hx   ? Rectal cancer Neg Hx   ? Stomach cancer Neg Hx   ? Esophageal cancer Neg Hx   ? ? ?Social History  ? ?Tobacco Use  ? Smoking status: Never  ? Smokeless tobacco: Never  ? Tobacco comments:  ?  02/16/2018 "smoked 2 wks in ~ 1977 & gave it"  ?Vaping Use  ? Vaping Use: Never used  ?Substance Use Topics  ? Alcohol use: Never  ? Drug use: Never  ? ? ?Current Outpatient Medications  ?Medication Sig Dispense Refill  ? amLODipine (NORVASC) 2.5 MG tablet Take 2.5  mg by mouth daily.    ? aspirin EC 81 MG EC tablet Take 1 tablet (81 mg total) by mouth daily. 30 tablet 3  ? Cholecalciferol (VITAMIN D) 50 MCG (2000 UT) CAPS Take by mouth.    ? clopidogrel (PLAVIX) 75 MG tablet clopidogrel 75 mg tablet    ? insulin glargine (LANTUS) 100 UNIT/ML injection Inject 25 Units into the skin 2 (two) times daily. Based on sugar levels    ? metoprolol tartrate (LOPRESSOR) 12.5 mg TABS tablet Take 0.5 tablets (12.5 mg total) by mouth 2 (two) times daily. 30 tablet 3  ? nitroGLYCERIN (NITROSTAT) 0.4 MG SL tablet Place  1 tablet (0.4 mg total) under the tongue every 5 (five) minutes as needed for chest pain. 25 tablet 12  ? ramipril (ALTACE) 5 MG capsule Take 1 capsule (5 mg total) by mouth daily. (Patient taking differently: Take 10 mg by mouth daily.) 30 capsule 3  ? vitamin C (ASCORBIC ACID) 500 MG tablet Take 500 mg by mouth daily.    ? Zinc Oxide-Vitamin C (ZINC PLUS VITAMIN C PO) Take by mouth.    ? ?No current facility-administered medications for this visit.  ? ? ?Allergies  ?Allergen Reactions  ? Sulfa Antibiotics Other (See Comments)  ?  Blisters on nose  ? ? ?Review of Systems:  ?Constitutional: Denies fever, chills, diaphoresis, appetite change and has fatigue.  ?HEENT: Denies photophobia, eye pain, redness, hearing loss, ear pain, congestion, sore throat, rhinorrhea, sneezing, mouth sores, neck pain, neck stiffness and tinnitus.   ?Respiratory: Denies SOB, DOE, cough, chest tightness,  and wheezing.   ?Cardiovascular: Denies chest pain, palpitations and leg swelling.  ?Genitourinary: Denies dysuria, urgency, frequency, hematuria, flank pain and difficulty urinating.  ?Musculoskeletal: Has arthritis and back pain ?Skin: No rash.  ?Neurological: Denies dizziness, seizures, syncope, weakness, light-headedness, numbness and headaches.  ?Hematological: Denies adenopathy. Easy bruising, personal or family bleeding history  ?Psychiatric/Behavioral: No anxiety or depression ? ?  ? ?Physical Exam:   ? ?BP 110/82   Pulse 60   Ht 5\' 5"  (1.651 m)   Wt 195 lb 6 oz (88.6 kg)   SpO2 96%   BMI 32.51 kg/m?  ?Wt Readings from Last 3 Encounters:  ?11/27/21 195 lb 6 oz (88.6 kg)  ?01/01/20 183 lb 12.8 oz (83.4 kg)  ?02/17/18 187 lb 11.2 oz (85.1 kg)  ? ?Constitutional:  Well-developed, in no acute distress. ?Psychiatric: Normal mood and affect. Behavior is normal. ?HEENT: Conjunctivae are normal. No scleral icterus. ?Cardiovascular: Normal rate, regular rhythm. No edema ?Pulmonary/chest: Effort normal and breath sounds normal. No  wheezing, rales or rhonchi. ?Abdominal: Soft, nondistended. Nontender. Bowel sounds active throughout. There are no masses palpable. No hepatomegaly. ?Rectal: Deferred ?Neurological: Alert and oriented to person place and time. ?Skin: Skin is warm and dry. No rashes noted. ? ?Data Reviewed: I have personally reviewed following labs and imaging studies ? ?CBC: ? ?  Latest Ref Rng & Units 01/01/2020  ?  4:18 AM 12/31/2019  ?  4:08 AM 12/30/2019  ?  2:40 AM  ?CBC  ?WBC 4.0 - 10.5 K/uL 5.1   5.4   6.6    ?Hemoglobin 12.0 - 15.0 g/dL 01/01/2020   53.2   99.2    ?Hematocrit 36.0 - 46.0 % 43.7   41.8   39.9    ?Platelets 150 - 400 K/uL 164   165   179    ? ? ?CMP: ? ?  Latest Ref Rng & Units 12/29/2019  ?  6:27 PM 02/17/2018  ?  2:24 AM 02/16/2018  ?  1:50 PM  ?CMP  ?Glucose 70 - 99 mg/dL 588   502   774    ?BUN 8 - 23 mg/dL 20   28   21     ?Creatinine 0.44 - 1.00 mg/dL 1.28   7.86   7.67    ?Sodium 135 - 145 mmol/L 137   141   141    ?Potassium 3.5 - 5.1 mmol/L 4.0   3.9   3.9    ?Chloride 98 - 111 mmol/L 102   108   109    ?CO2 22 - 32 mmol/L 25   24   23     ?Calcium 8.9 - 10.3 mg/dL 9.3   8.5   8.9    ?Total Protein 6.5 - 8.1 g/dL 6.2      ?Total Bilirubin 0.3 - 1.2 mg/dL 0.9      ?Alkaline Phos 38 - 126 U/L 45      ?AST 15 - 41 U/L 26      ?ALT 0 - 44 U/L 24      ? ? ? ? ? ? ?Edman Circle, MD 11/27/2021, 10:20 AM ? ?Cc: Hadley Pen, MD ? ? ?

## 2021-12-02 ENCOUNTER — Telehealth: Payer: Self-pay

## 2021-12-02 NOTE — Telephone Encounter (Signed)
LVM.  Dr Sharyn Lull approved patient for holding her Plavix 5 days prior to procedure at our request ? ?Dr said she could hold it 7 days prior. ?

## 2021-12-04 NOTE — Telephone Encounter (Signed)
Patient made aware to hold plavix 5 days prior to procedure and voiced understanding ?

## 2022-01-05 ENCOUNTER — Encounter: Payer: Self-pay | Admitting: Gastroenterology

## 2022-01-12 ENCOUNTER — Ambulatory Visit (AMBULATORY_SURGERY_CENTER): Payer: HMO | Admitting: Gastroenterology

## 2022-01-12 ENCOUNTER — Encounter: Payer: Self-pay | Admitting: Gastroenterology

## 2022-01-12 VITALS — BP 98/59 | HR 68 | Temp 98.4°F | Resp 16 | Ht 65.0 in | Wt 195.0 lb

## 2022-01-12 DIAGNOSIS — Z86010 Personal history of colonic polyps: Secondary | ICD-10-CM | POA: Diagnosis not present

## 2022-01-12 DIAGNOSIS — K635 Polyp of colon: Secondary | ICD-10-CM | POA: Diagnosis not present

## 2022-01-12 DIAGNOSIS — Z09 Encounter for follow-up examination after completed treatment for conditions other than malignant neoplasm: Secondary | ICD-10-CM | POA: Diagnosis not present

## 2022-01-12 MED ORDER — SODIUM CHLORIDE 0.9 % IV SOLN: 500.0000 mL | Freq: Once | INTRAVENOUS | Status: DC

## 2022-01-12 NOTE — Progress Notes (Signed)
Pt's states no medical or surgical changes since previsit or office visit. 

## 2022-01-12 NOTE — Progress Notes (Signed)
Called to room to assist during endoscopic procedure.  Patient ID and intended procedure confirmed with present staff. Received instructions for my participation in the procedure from the performing physician.  

## 2022-01-12 NOTE — Progress Notes (Signed)
Pt non-responsive, VVS, Report to RN  °

## 2022-01-12 NOTE — Progress Notes (Signed)
Chief Complaint: For colonoscopy  Referring Provider:  Hadley Pen, MD      ASSESSMENT AND PLAN;   #1. H/O polyps   Plan: -Colon after cardio clearence from Dr Lorayne Marek, off plavix x 5 days. Continue ASA   Discussed risks & benefits of colonoscopy. Risks including rare perforation req laparotomy, bleeding after bx/polypectomy req blood transfusion, rarely missing neoplasms, risks of anesthesia/sedation, rare risk of damage to internal organs. Benefits outweigh the risks. Patient agrees to proceed. All the questions were answered. Pt consents to proceed. HPI:    [name] is a 66 y.o. female  With CAD s/p DES 2016 on ASA/Plavix, HLD, DM2, HTN, OA  No nausea, vomiting, heartburn (occ TUMS), regurgitation, odynophagia or dysphagia.  No significant diarrhea.  No melena or hematochezia. No unintentional weight loss. No abdominal pain.  Occ constipation, better with increased water.  Previous GI work-up: Colonoscopy 08/07/2015 ([facility]) -Colonic polyp s/p polypectomy -Pancolonic diverticulosis predominantly in the sigmoid colon. -Repeat in 5 years  SH-single, interior designer Past Medical History:  Diagnosis Date   Arthritis    "qwhere" (02/16/2018)   Chest pain due to CAD (HCC) 12/29/2019   GERD (gastroesophageal reflux disease)    Heart murmur    High cholesterol    History of hiatal hernia dx'd 1979   History of kidney stones    "passed them"   Hypertension    Myocardial infarct (HCC) 02/2014   Type II diabetes mellitus (HCC)    fasting blood sugars 150s    Past Surgical History:  Procedure Laterality Date   ANTERIOR CERVICAL DECOMP/DISCECTOMY FUSION N/A 11/20/2012   Procedure: ANTERIOR CERVICAL DECOMPRESSION/DISCECTOMY FUSION 1 LEVEL;  Surgeon: Hewitt Shorts, MD;  Location: [facility];  Service: Neurosurgery;  Laterality: N/A;  Cervical four - five  anterior cervical decompression with fusion plating and bonegraft   BACK SURGERY     BASAL CELL  CARCINOMA EXCISION     "took off in the office; off my face" (02/16/2018)   CATARACT EXTRACTION W/ INTRAOCULAR LENS  IMPLANT, BILATERAL Bilateral    COLONOSCOPY  08/07/2015   Colonic polyp status post polypectomy. Pancolonic diverticulosis predominantly in the sigmoid colon   LEFT HEART CATH AND CORONARY ANGIOGRAPHY N/A 01/01/2020   Procedure: LEFT HEART CATH AND CORONARY ANGIOGRAPHY;  Surgeon: Rinaldo Cloud, MD;  Location: [facility];  Service: Cardiovascular;  Laterality: N/A;   LEFT HEART CATHETERIZATION WITH CORONARY ANGIOGRAM N/A 03/05/2014   Procedure: LEFT HEART CATHETERIZATION WITH CORONARY ANGIOGRAM;  Surgeon: Robynn Pane, MD;  Location: [facility];  Service: Cardiovascular;  Laterality: N/A;   NECK SURGERY  2012   stent placement 2015     TONSILLECTOMY      Family History  Problem Relation Age of Onset   Diabetes Mother    Heart attack Mother    Emphysema Father    Colon cancer Neg Hx    Rectal cancer Neg Hx    Stomach cancer Neg Hx    Esophageal cancer Neg Hx     Social History   Tobacco Use   Smoking status: Never   Smokeless tobacco: Never   Tobacco comments:    02/16/2018 "smoked 2 wks in ~ 1977 & gave it"  Vaping Use   Vaping Use: Never used  Substance Use Topics   Alcohol use: Never   Drug use: Never    Current Outpatient Medications  Medication Sig Dispense Refill   amLODipine (NORVASC) 2.5 MG tablet Take 2.5  mg by mouth daily.     aspirin EC 81 MG EC tablet Take 1 tablet (81 mg total) by mouth daily. 30 tablet 3   Cholecalciferol (VITAMIN D) 50 MCG (2000 UT) CAPS Take by mouth.     clopidogrel (PLAVIX) 75 MG tablet clopidogrel 75 mg tablet     insulin glargine (LANTUS) 100 UNIT/ML injection Inject 25 Units into the skin 2 (two) times daily. Based on sugar levels     metoprolol tartrate (LOPRESSOR) 12.5 mg TABS tablet Take 0.5 tablets (12.5 mg total) by mouth 2 (two) times daily. 30 tablet 3   ramipril (ALTACE) 5 MG capsule Take 1 capsule (5  mg total) by mouth daily. (Patient taking differently: Take 10 mg by mouth daily.) 30 capsule 3   vitamin C (ASCORBIC ACID) 500 MG tablet Take 500 mg by mouth daily.     Zinc Oxide-Vitamin C (ZINC PLUS VITAMIN C PO) Take by mouth.     nitroGLYCERIN (NITROSTAT) 0.4 MG SL tablet Place 1 tablet (0.4 mg total) under the tongue every 5 (five) minutes as needed for chest pain. 25 tablet 12   Current Facility-Administered Medications  Medication Dose Route Frequency Provider Last Rate Last Admin   0.9 %  sodium chloride infusion  500 mL Intravenous Once Lynann Bologna, MD        Allergies  Allergen Reactions   Sulfa Antibiotics Other (See Comments)    Blisters on nose    Review of Systems:  Constitutional: Denies fever, chills, diaphoresis, appetite change and has fatigue.  HEENT: Denies photophobia, eye pain, redness, hearing loss, ear pain, congestion, sore throat, rhinorrhea, sneezing, mouth sores, neck pain, neck stiffness and tinnitus.   Respiratory: Denies SOB, DOE, cough, chest tightness,  and wheezing.   Cardiovascular: Denies chest pain, palpitations and leg swelling.  Genitourinary: Denies dysuria, urgency, frequency, hematuria, flank pain and difficulty urinating.  Musculoskeletal: Has arthritis and back pain Skin: No rash.  Neurological: Denies dizziness, seizures, syncope, weakness, light-headedness, numbness and headaches.  Hematological: Denies adenopathy. Easy bruising, personal or family bleeding history  Psychiatric/Behavioral: No anxiety or depression     Physical Exam:    BP 117/77   Pulse 71   Temp 98.4 F (36.9 C) (Temporal)   Ht 5\' 5"  (1.651 m)   Wt 195 lb (88.5 kg)   SpO2 94%   BMI 32.45 kg/m  Wt Readings from Last 3 Encounters:  01/12/22 195 lb (88.5 kg)  11/27/21 195 lb 6 oz (88.6 kg)  01/01/20 183 lb 12.8 oz (83.4 kg)   Constitutional:  Well-developed, in no acute distress. Psychiatric: Normal mood and affect. Behavior is normal. HEENT: Conjunctivae  are normal. No scleral icterus. Cardiovascular: Normal rate, regular rhythm. No edema Pulmonary/chest: Effort normal and breath sounds normal. No wheezing, rales or rhonchi. Abdominal: Soft, nondistended. Nontender. Bowel sounds active throughout. There are no masses palpable. No hepatomegaly. Rectal: Deferred Neurological: Alert and oriented to person place and time. Skin: Skin is warm and dry. No rashes noted.  Data Reviewed: I have personally reviewed following labs and imaging studies  CBC:    Latest Ref Rng & Units 01/01/2020    4:18 AM 12/31/2019    4:08 AM 12/30/2019    2:40 AM  CBC  WBC 4.0 - 10.5 K/uL 5.1   5.4   6.6    Hemoglobin 12.0 - 15.0 g/dL 66.5   99.3   57.0    Hematocrit 36.0 - 46.0 % 43.7   41.8   39.9  Platelets 150 - 400 K/uL 164   165   179      CMP:    Latest Ref Rng & Units 12/29/2019    6:27 PM 02/17/2018    2:24 AM 02/16/2018    1:50 PM  CMP  Glucose 70 - 99 mg/dL 161   096   045    BUN 8 - 23 mg/dL 20   28   21     Creatinine 0.44 - 1.00 mg/dL   4.09   8.11    Sodium 135 - 145 mmol/L 137   141   141    Potassium 3.5 - 5.1 mmol/L 4.0   3.9   3.9    Chloride 98 - 111 mmol/L 102   108   109    CO2 22 - 32 mmol/L 25   24   23     Calcium 8.9 - 10.3 mg/dL 9.3   8.5   8.9    Total Protein 6.5 - 8.1 g/dL 6.2      Total Bilirubin 0.3 - 1.2 mg/dL 0.9      Alkaline Phos 38 - 126 U/L 45      AST 15 - 41 U/L 26      ALT 0 - 44 U/L 24            9.14, MD 01/12/2022, 2:45 PM  Cc: Edman Circle, MD

## 2022-01-12 NOTE — Op Note (Signed)
[facility] Patient Name: [name] Procedure Date: 01/12/2022 2:49 PM MRN: [id number] Endoscopist: Jackquline Denmark , MD Age: 66 Referring MD:  Date of Birth: 1956/01/28 Gender: Female Account #: [id number] Procedure:                Colonoscopy Indications:              High risk colon cancer surveillance: Personal                            history of colonic polyps Medicines:                Monitored Anesthesia Care Procedure:                Pre-Anesthesia Assessment:                           - Prior to the procedure, a History and Physical                            was performed, and patient medications and                            allergies were reviewed. The patient's tolerance of                            previous anesthesia was also reviewed. The risks                            and benefits of the procedure and the sedation                            options and risks were discussed with the patient.                            All questions were answered, and informed consent                            was obtained. Prior Anticoagulants: Plavix was held                            5 days prior. ASA Grade Assessment: II - A patient                            with mild systemic disease. After reviewing the                            risks and benefits, the patient was deemed in                            satisfactory condition to undergo the procedure.                           After obtaining informed consent, the colonoscope  was passed under direct vision. Throughout the                            procedure, the patient's blood pressure, pulse, and                            oxygen saturations were monitored continuously. The                            [id number]-HQ190L Colonoscope was introduced through the                            anus and advanced to the 2 cm into the ileum. The                            colonoscopy was performed  without difficulty. The                            patient tolerated the procedure well. The quality                            of the bowel preparation was good. The terminal                            ileum, ileocecal valve, appendiceal orifice, and                            rectum were photographed. Scope In: 2:55:00 PM Scope Out: 3:09:57 PM Scope Withdrawal Time: 0 hours 9 minutes 48 seconds  Total Procedure Duration: 0 hours 14 minutes 57 seconds  Findings:                 The ileocecal valve was prominent and lipomatous.                            Biopsies were taken with a cold forceps for                            histology.                           Multiple medium-mouthed diverticula were found in                            the sigmoid colon, descending colon and ascending                            colon.                           Non-bleeding internal hemorrhoids were found during                            retroflexion. The hemorrhoids were small and Grade  I (internal hemorrhoids that do not prolapse).                           The terminal ileum appeared normal.                           The exam was otherwise without abnormality on                            direct and retroflexion views. Complications:            No immediate complications. Estimated Blood Loss:     Estimated blood loss: none. Impression:               - Prominent ileocecal valve. Biopsied.                           - Pancolonic diverticulosis predominantly in the                            sigmoid colon.                           - Non-bleeding internal hemorrhoids.                           - The examined portion of the ileum was normal.                           - The examination was otherwise normal on direct                            and retroflexion views. Recommendation:           - Patient has a contact number available for                            emergencies.  The signs and symptoms of potential                            delayed complications were discussed with the                            patient. Return to normal activities tomorrow.                            Written discharge instructions were provided to the                            patient.                           - High fiber diet.                           - Continue present medications.                           -  Await pathology results.                           - Repeat colonoscopy for surveillance based on                            pathology results.                           - The findings and recommendations were discussed                            with the patient's family.                           - Resume Plavix from tomorrow onwards. Jackquline Denmark, MD 01/12/2022 3:14:04 PM This report has been signed electronically.

## 2022-01-12 NOTE — Patient Instructions (Addendum)
Resume Plavix tomorrow onward   Handout on high fiber diet , diverticulosis and hemorrhoids provided   Await pathology results.   Continue current medications.   YOU HAD AN ENDOSCOPIC PROCEDURE TODAY AT THE [facility]:   Refer to the procedure report that was given to you for any specific questions about what was found during the examination.  If the procedure report does not answer your questions, please call your gastroenterologist to clarify.  If you requested that your care partner not be given the details of your procedure findings, then the procedure report has been included in a sealed envelope for you to review at your convenience later.  YOU SHOULD EXPECT: Some feelings of bloating in the abdomen. Passage of more gas than usual.  Walking can help get rid of the air that was put into your GI tract during the procedure and reduce the bloating. If you had a lower endoscopy (such as a colonoscopy or flexible sigmoidoscopy) you may notice spotting of blood in your stool or on the toilet paper. If you underwent a bowel prep for your procedure, you may not have a normal bowel movement for a few days.  Please Note:  You might notice some irritation and congestion in your nose or some drainage.  This is from the oxygen used during your procedure.  There is no need for concern and it should clear up in a day or so.  SYMPTOMS TO REPORT IMMEDIATELY:  Following lower endoscopy (colonoscopy or flexible sigmoidoscopy):  Excessive amounts of blood in the stool  Significant tenderness or worsening of abdominal pains  Swelling of the abdomen that is new, acute  Fever of 100F or higher  For urgent or emergent issues, a gastroenterologist can be reached at any hour by calling [phone]. Do not use MyChart messaging for urgent concerns.    DIET:  We do recommend a small meal at first, but then you may proceed to your regular diet.  Drink plenty of fluids but you should avoid  alcoholic beverages for 24 hours.  ACTIVITY:  You should plan to take it easy for the rest of today and you should NOT DRIVE or use heavy machinery until tomorrow (because of the sedation medicines used during the test).    FOLLOW UP: Our staff will call the number listed on your records 24-72 hours following your procedure to check on you and address any questions or concerns that you may have regarding the information given to you following your procedure. If we do not reach you, we will leave a message.  We will attempt to reach you two times.  During this call, we will ask if you have developed any symptoms of COVID 19. If you develop any symptoms (ie: fever, flu-like symptoms, shortness of breath, cough etc.) before then, please call [phone].  If you test positive for Covid 19 in the 2 weeks post procedure, please call and report this information to [place].    If any biopsies were taken you will be contacted by phone or by letter within the next 1-3 weeks.  Please call us at [phone] if you have not heard about the biopsies in 3 weeks.    SIGNATURES/CONFIDENTIALITY: You and/or your care partner have signed paperwork which will be entered into your electronic medical record.  These signatures attest to the fact that that the information above on your After Visit Summary has been reviewed and is understood.  Full responsibility of the confidentiality of this  discharge information lies with you and/or your care-partner.

## 2022-01-13 ENCOUNTER — Telehealth: Payer: Self-pay | Admitting: *Deleted

## 2022-01-13 NOTE — Telephone Encounter (Signed)
  Follow up Call-     01/12/2022    1:31 PM  Call back number  Post procedure Call Back phone  # [phone]  Permission to leave phone message Yes     Patient questions:  Do you have a fever, pain , or abdominal swelling? No. Pain Score  0 *  Have you tolerated food without any problems? Yes.    Have you been able to return to your normal activities? Yes.    Do you have any questions about your discharge instructions: Diet   No. Medications  No. Follow up visit  No.  Do you have questions or concerns about your Care? No.  Actions: * If pain score is 4 or above: No action needed, pain <4.

## 2022-01-17 ENCOUNTER — Encounter: Payer: Self-pay | Admitting: Gastroenterology

## 2022-04-09 ENCOUNTER — Other Ambulatory Visit: Payer: Self-pay | Admitting: Obstetrics & Gynecology

## 2022-04-09 DIAGNOSIS — R928 Other abnormal and inconclusive findings on diagnostic imaging of breast: Secondary | ICD-10-CM

## 2022-04-26 ENCOUNTER — Ambulatory Visit
Admission: RE | Admit: 2022-04-26 | Discharge: 2022-04-26 | Disposition: A | Discharge status: Home / Self Care | Payer: HMO | Source: Ambulatory Visit | Attending: Obstetrics & Gynecology | Admitting: Obstetrics & Gynecology

## 2022-04-26 ENCOUNTER — Other Ambulatory Visit: Payer: Self-pay | Admitting: Obstetrics & Gynecology

## 2022-04-26 DIAGNOSIS — N6310 Unspecified lump in the right breast, unspecified quadrant: Secondary | ICD-10-CM

## 2022-04-26 DIAGNOSIS — R928 Other abnormal and inconclusive findings on diagnostic imaging of breast: Secondary | ICD-10-CM

## 2022-04-28 ENCOUNTER — Ambulatory Visit
Admission: RE | Admit: 2022-04-28 | Discharge: 2022-04-28 | Disposition: A | Discharge status: Home / Self Care | Payer: HMO | Source: Ambulatory Visit | Attending: Obstetrics & Gynecology | Admitting: Obstetrics & Gynecology

## 2022-04-28 DIAGNOSIS — N6310 Unspecified lump in the right breast, unspecified quadrant: Secondary | ICD-10-CM

## 2024-02-23 ENCOUNTER — Other Ambulatory Visit: Payer: Self-pay | Admitting: Neurosurgery

## 2024-02-23 DIAGNOSIS — M48061 Spinal stenosis, lumbar region without neurogenic claudication: Secondary | ICD-10-CM

## 2024-02-23 DIAGNOSIS — M4722 Other spondylosis with radiculopathy, cervical region: Secondary | ICD-10-CM

## 2024-03-05 NOTE — Discharge Instructions (Signed)

## 2024-03-06 ENCOUNTER — Ambulatory Visit
Admission: RE | Admit: 2024-03-06 | Discharge: 2024-03-06 | Disposition: A | Discharge status: Home / Self Care | Source: Ambulatory Visit | Attending: Neurosurgery | Admitting: Neurosurgery

## 2024-03-06 DIAGNOSIS — M48061 Spinal stenosis, lumbar region without neurogenic claudication: Secondary | ICD-10-CM

## 2024-03-06 DIAGNOSIS — M4722 Other spondylosis with radiculopathy, cervical region: Secondary | ICD-10-CM

## 2024-03-06 MED ORDER — DIAZEPAM 5 MG PO TABS: 5.0000 mg | ORAL_TABLET | Freq: Once | ORAL | Status: DC

## 2024-03-06 MED ORDER — ONDANSETRON HCL 4 MG/2ML IJ SOLN: 4.0000 mg | Freq: Once | INTRAMUSCULAR | Status: DC | PRN

## 2024-03-06 MED ORDER — IOPAMIDOL (ISOVUE-M 300) INJECTION 61%
10.0000 mL | Freq: Once | INTRAMUSCULAR | Status: AC
  Administered 2024-03-06: 10 mL via INTRATHECAL

## 2024-03-06 MED ORDER — MEPERIDINE HCL 50 MG/ML IJ SOLN: 50.0000 mg | Freq: Once | INTRAMUSCULAR | Status: DC | PRN
